# Patient Record
Sex: Female | Born: 2000 | Race: White | Hispanic: No | Marital: Single | Smoking: Never smoker
Health system: Southern US, Community
[De-identification: ages and names within clinical notes are randomized; demographics above are authoritative.]

## PROBLEM LIST (undated history)

## (undated) DIAGNOSIS — F319 Bipolar disorder, unspecified: Secondary | ICD-10-CM

## (undated) DIAGNOSIS — J302 Other seasonal allergic rhinitis: Secondary | ICD-10-CM

## (undated) DIAGNOSIS — F909 Attention-deficit hyperactivity disorder, unspecified type: Secondary | ICD-10-CM

---
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
## 2018-02-27 DIAGNOSIS — K5901 Slow transit constipation: Secondary | ICD-10-CM | POA: Insufficient documentation

## 2020-05-12 DIAGNOSIS — L732 Hidradenitis suppurativa: Secondary | ICD-10-CM | POA: Insufficient documentation

## 2021-08-02 ENCOUNTER — Observation Stay (HOSPITAL_COMMUNITY)
Admission: EM | Admit: 2021-08-02 | Discharge: 2021-08-03 | Disposition: A | Discharge status: Other Acute Inpatient Hospital | Payer: Medicaid Other | Attending: Internal Medicine | Admitting: Internal Medicine

## 2021-08-02 ENCOUNTER — Encounter (HOSPITAL_COMMUNITY): Payer: Self-pay

## 2021-08-02 ENCOUNTER — Other Ambulatory Visit: Payer: Self-pay

## 2021-08-02 DIAGNOSIS — R7989 Other specified abnormal findings of blood chemistry: Secondary | ICD-10-CM

## 2021-08-02 DIAGNOSIS — E669 Obesity, unspecified: Secondary | ICD-10-CM | POA: Insufficient documentation

## 2021-08-02 DIAGNOSIS — Z20822 Contact with and (suspected) exposure to covid-19: Secondary | ICD-10-CM | POA: Diagnosis not present

## 2021-08-02 DIAGNOSIS — T391X2A Poisoning by 4-Aminophenol derivatives, intentional self-harm, initial encounter: Principal | ICD-10-CM | POA: Diagnosis present

## 2021-08-02 DIAGNOSIS — E876 Hypokalemia: Secondary | ICD-10-CM | POA: Diagnosis not present

## 2021-08-02 DIAGNOSIS — Z79899 Other long term (current) drug therapy: Secondary | ICD-10-CM | POA: Insufficient documentation

## 2021-08-02 DIAGNOSIS — R7401 Elevation of levels of liver transaminase levels: Secondary | ICD-10-CM | POA: Diagnosis not present

## 2021-08-02 DIAGNOSIS — F1910 Other psychoactive substance abuse, uncomplicated: Secondary | ICD-10-CM | POA: Diagnosis present

## 2021-08-02 DIAGNOSIS — K2100 Gastro-esophageal reflux disease with esophagitis, without bleeding: Secondary | ICD-10-CM | POA: Diagnosis present

## 2021-08-02 DIAGNOSIS — Z6835 Body mass index (BMI) 35.0-35.9, adult: Secondary | ICD-10-CM | POA: Insufficient documentation

## 2021-08-02 DIAGNOSIS — F319 Bipolar disorder, unspecified: Secondary | ICD-10-CM | POA: Diagnosis present

## 2021-08-02 DIAGNOSIS — E66812 Obesity, class 2: Secondary | ICD-10-CM | POA: Diagnosis present

## 2021-08-02 LAB — CBC WITH DIFFERENTIAL/PLATELET
Abs Immature Granulocytes: 0.03 10*3/uL (ref 0.00–0.07)
Basophils Absolute: 0 10*3/uL (ref 0.0–0.1)
Basophils Relative: 0 %
Eosinophils Absolute: 0.1 10*3/uL (ref 0.0–0.5)
Eosinophils Relative: 1 %
HCT: 44.5 % (ref 36.0–46.0)
Hemoglobin: 15 g/dL (ref 12.0–15.0)
Immature Granulocytes: 0 %
Lymphocytes Relative: 25 %
Lymphs Abs: 2.7 10*3/uL (ref 0.7–4.0)
MCH: 27.3 pg (ref 26.0–34.0)
MCHC: 33.7 g/dL (ref 30.0–36.0)
MCV: 81.1 fL (ref 80.0–100.0)
Monocytes Absolute: 0.9 10*3/uL (ref 0.1–1.0)
Monocytes Relative: 8 %
Neutro Abs: 7.3 10*3/uL (ref 1.7–7.7)
Neutrophils Relative %: 66 %
Platelets: 340 10*3/uL (ref 150–400)
RBC: 5.49 MIL/uL — ABNORMAL HIGH (ref 3.87–5.11)
RDW: 13.7 % (ref 11.5–15.5)
WBC: 11 10*3/uL — ABNORMAL HIGH (ref 4.0–10.5)
nRBC: 0 % (ref 0.0–0.2)

## 2021-08-02 LAB — COMPREHENSIVE METABOLIC PANEL
ALT: 54 U/L — ABNORMAL HIGH (ref 0–44)
ALT: 44 U/L (ref 0–44)
AST: 45 U/L — ABNORMAL HIGH (ref 15–41)
AST: 21 U/L (ref 15–41)
Albumin: 4.7 g/dL (ref 3.5–5.0)
Albumin: 3.8 g/dL (ref 3.5–5.0)
Alkaline Phosphatase: 102 U/L (ref 38–126)
Alkaline Phosphatase: 74 U/L (ref 38–126)
Anion gap: 10 (ref 5–15)
Anion gap: 11 (ref 5–15)
BUN: 5 mg/dL — ABNORMAL LOW (ref 6–20)
BUN: <5 mg/dL — ABNORMAL LOW (ref 6–20)
CO2: 25 mmol/L (ref 22–32)
CO2: 25 mmol/L (ref 22–32)
Calcium: 10 mg/dL (ref 8.9–10.3)
Calcium: 9.4 mg/dL (ref 8.9–10.3)
Chloride: 102 mmol/L (ref 98–111)
Chloride: 103 mmol/L (ref 98–111)
Creatinine, Ser: 0.81 mg/dL (ref 0.44–1.00)
Creatinine, Ser: 0.56 mg/dL (ref 0.44–1.00)
GFR, Estimated: >60 mL/min (ref >60)
GFR, Estimated: >60 mL/min (ref >60)
Glucose, Bld: 107 mg/dL — ABNORMAL HIGH (ref 70–99)
Glucose, Bld: 102 mg/dL — ABNORMAL HIGH (ref 70–99)
Potassium: 4.2 mmol/L (ref 3.5–5.1)
Potassium: 3.1 mmol/L — ABNORMAL LOW (ref 3.5–5.1)
Sodium: 137 mmol/L (ref 135–145)
Sodium: 139 mmol/L (ref 135–145)
Total Bilirubin: 1.6 mg/dL — ABNORMAL HIGH (ref 0.3–1.2)
Total Bilirubin: 0.9 mg/dL (ref 0.3–1.2)
Total Protein: 9.1 g/dL — ABNORMAL HIGH (ref 6.5–8.1)
Total Protein: 7.5 g/dL (ref 6.5–8.1)

## 2021-08-02 LAB — MAGNESIUM: Magnesium: 2 mg/dL (ref 1.7–2.4)

## 2021-08-02 LAB — I-STAT BETA HCG BLOOD, ED (MC, WL, AP ONLY): I-stat hCG, quantitative: <5 m[IU]/mL (ref <5)

## 2021-08-02 LAB — RAPID URINE DRUG SCREEN, HOSP PERFORMED
Amphetamines: POSITIVE — AB
Barbiturates: NOT DETECTED
Benzodiazepines: NOT DETECTED
Cocaine: NOT DETECTED
Opiates: NOT DETECTED
Tetrahydrocannabinol: POSITIVE — AB

## 2021-08-02 LAB — PROTIME-INR
INR: 1 (ref 0.8–1.2)
Prothrombin Time: 13.2 seconds (ref 11.4–15.2)

## 2021-08-02 LAB — ACETAMINOPHEN LEVEL
Acetaminophen (Tylenol), Serum: 57 ug/mL — ABNORMAL HIGH (ref 10–30)
Acetaminophen (Tylenol), Serum: <10 ug/mL — ABNORMAL LOW (ref 10–30)

## 2021-08-02 LAB — SALICYLATE LEVEL: Salicylate Lvl: <7 mg/dL — ABNORMAL LOW (ref 7.0–30.0)

## 2021-08-02 LAB — PHOSPHORUS: Phosphorus: 3.2 mg/dL (ref 2.5–4.6)

## 2021-08-02 LAB — ETHANOL: Alcohol, Ethyl (B): <10 mg/dL (ref <10)

## 2021-08-02 MED ORDER — ONDANSETRON HCL 4 MG PO TABS: 4.0000 mg | ORAL_TABLET | Freq: Four times a day (QID) | ORAL | Status: DC | PRN

## 2021-08-02 MED ORDER — PANTOPRAZOLE SODIUM 40 MG PO TBEC
40.0000 mg | DELAYED_RELEASE_TABLET | Freq: Every day | ORAL | Status: DC
  Administered 2021-08-02 – 2021-08-03 (×2): 40 mg via ORAL
  Filled 2021-08-02 (×2): qty 1

## 2021-08-02 MED ORDER — DEXTROSE 5 % IV SOLN
15.0000 mg/kg/h | INTRAVENOUS | Status: DC
  Administered 2021-08-02 (×2): 15 mg/kg/h via INTRAVENOUS
  Filled 2021-08-02 (×3): qty 90

## 2021-08-02 MED ORDER — ACETYLCYSTEINE LOAD VIA INFUSION
150.0000 mg/kg | Freq: Once | INTRAVENOUS | Status: AC
Start: 2021-08-02 — End: 2021-08-02
  Administered 2021-08-02: 13605 mg via INTRAVENOUS
  Filled 2021-08-02: qty 446

## 2021-08-02 MED ORDER — LACTATED RINGERS IV SOLN: INTRAVENOUS | Status: DC

## 2021-08-02 MED ORDER — LACTATED RINGERS IV BOLUS
1000.0000 mL | Freq: Once | INTRAVENOUS | Status: AC
  Administered 2021-08-02: 1000 mL via INTRAVENOUS

## 2021-08-02 MED ORDER — ONDANSETRON HCL 4 MG/2ML IJ SOLN
4.0000 mg | Freq: Four times a day (QID) | INTRAMUSCULAR | Status: DC | PRN
  Administered 2021-08-02: 4 mg via INTRAVENOUS
  Filled 2021-08-02: qty 2

## 2021-08-02 NOTE — ED Notes (Signed)
Pt has been wanded by security. 

## 2021-08-02 NOTE — H&P (Signed)
History and Physical    Patient: Brittany Duncan WUJ:[id number] DOB: Jan 25, 2001 DOA: 08/02/2021 DOS: the patient was seen and examined on 08/02/2021 PCP: Derald Macleod, MD  Patient coming from: Home  Chief Complaint: No chief complaint on file.  HPI: Brittany Duncan is a 21 y.o. female with medical history significant of ADHD, bipolar disorder, seasonal allergies, GERD, class II obesity who is coming to the emergency department with complaints of worsening depression which prompted her to take 8 tablets of extra strength acetaminophen, alcohol, methamphetamine and cannabis.  She stated that she has had a lot of "life in general" stress, but did not specify what is specifically.  However, stated that her depression was worse.  She denied fever, chills, rhinorrhea, sore throat, wheezing or hemoptysis.  No chest pain, palpitations, diaphoresis, PND, orthopnea or pitting edema of the lower extremities.  No abdominal pain, nausea, emesis, diarrhea, constipation, melena or hematochezia.  No flank pain, dysuria, frequency or hematuria.  No polyuria, polydipsia, polyphagia or blurred vision.   ED course: Initial vital signs were temperature 97.8 F, pulse 82, respiration 13, BP 123/86 mmHg O2 sat 100% on room air.  The patient was started on Mucomyst per pharmacy.  I added 1000 mL of LR bolus, followed by LR infusion.  Lab work: UDS was positive for amphetamines and THC.  CBC showed a white count 11.0, with 66% neutrophils, hemoglobin 15.0 g/dL platelets 956.  Normal PT and INR.   Review of Systems: As mentioned in the history of present illness. All other systems reviewed and are negative.  Past Medical History:  Diagnosis Date   ADHD (attention deficit hyperactivity disorder)    Bipolar disorder (HCC)    Seasonal allergies    History reviewed. No pertinent surgical history. Social History:  reports that she has never smoked. She does not have any smokeless tobacco history on file. She reports  that she does not drink alcohol and does not use drugs.  Allergies  Allergen Reactions   Lactose Intolerance (Gi) Diarrhea    Family History  Problem Relation Age of Onset   Drug abuse Mother    Drug abuse Father     Prior to Admission medications   Medication Sig Start Date End Date Taking? Authorizing Provider  acetaminophen (TYLENOL) 500 MG tablet Take 1,000 mg by mouth every 6 (six) hours as needed for mild pain.   Yes [provider]  Melatonin 5 MG CHEW Chew 5 mg by mouth at bedtime as needed (sleep).   Yes [provider]    Physical Exam: Vitals:   08/02/21 1300 08/02/21 1330 08/02/21 1400 08/02/21 1542  BP: 121/85 107/68 118/83 110/68  Pulse: 63 86 71 61  Resp:   18 (!) 22  Temp:    (!) 97.5 F (36.4 C)  TempSrc:    Oral  SpO2: 98% 100% 99% 100%  Weight:      Height:       Physical Exam Vitals and nursing note reviewed.  Constitutional:      Appearance: Normal appearance. She is obese.  HENT:     Head: Normocephalic.     Mouth/Throat:     Mouth: Mucous membranes are moist.  Eyes:     General: No scleral icterus.    Pupils: Pupils are equal, round, and reactive to light.  Neck:     Vascular: No JVD.  Cardiovascular:     Rate and Rhythm: Regular rhythm. Tachycardia present.     Heart sounds: S1 normal and S2  normal.  Pulmonary:     Effort: Pulmonary effort is normal.     Breath sounds: No wheezing, rhonchi or rales.  Abdominal:     General: Bowel sounds are normal.     Palpations: Abdomen is soft.     Tenderness: There is no abdominal tenderness. There is no right CVA tenderness, left CVA tenderness or guarding.  Musculoskeletal:     Cervical back: Neck supple.     Right lower leg: No edema.     Left lower leg: No edema.  Skin:    General: Skin is warm and dry.  Neurological:     General: No focal deficit present.     Mental Status: She is alert and oriented to person, place, and time.  Psychiatric:        Mood and Affect: Mood  normal.        Behavior: Behavior normal.   Data Reviewed:  There are no new results to review at this time.  Assessment and Plan: Principal Problem:   Acetaminophen overdose,  intentional self-harm, initial encounter (HCC) Observation/PCU. Continue acetylcysteine per pharmacy. Continue IV fluids. F/up acetaminophen level. Monitor hepatic function. Monitor phosphorus level. One-to-one observation.  Active Problems:   Polysubstance abuse (HCC) Consult behavioral health and TOC.    Bipolar 1 disorder (HCC) Consult behavioral health.    GERD with history of esophagitis Resume pantoprazole.    Class 2 obesity With a BMI of 35.43 kg/m. Lifestyle modifications advised. Follow-up with primary moderate care provider.    Advance Care Planning:   Code Status: Full Code   Consults: Behavioral health.  Family Communication:   Severity of Illness: The appropriate patient status for this patient is OBSERVATION. Observation status is judged to be reasonable and necessary in order to provide the required intensity of service to ensure the patient's safety. The patient's presenting symptoms, physical exam findings, and initial radiographic and laboratory data in the context of their medical condition is felt to place them at decreased risk for further clinical deterioration. Furthermore, it is anticipated that the patient will be medically stable for discharge from the hospital within 2 midnights of admission.   Author: Bobette Mo, MD 08/02/2021 4:33 PM  For on call review [email or web address].   This document was prepared using [employer] and may contain some unintended transcription errors

## 2021-08-02 NOTE — ED Notes (Signed)
Pt said she doesn't need to use the bathroom right now

## 2021-08-02 NOTE — ED Triage Notes (Signed)
Pt BIB EMS from home. Patient feeling more depressed. Patient took handful of tylenol and does confess to using some alcohol.

## 2021-08-02 NOTE — Progress Notes (Signed)
Pharmacy Consult for Acetadote  Patient Measurements: Height: 5\' 3"  (160 cm) Weight: 90.7 kg (200 lb) IBW/kg (Calculated) : 52.4  Labs: APAP = 57 AST = 45 ALT = 54 Tbili = 1.6  Plan:  Per [place] [facility] recommendations: Acetadote 150mg /kg IV bolus followed by 15mg /kg/hr continuous infusion.  Check APAP and LFTs in 12 hrs.  If APAP undetectable and LFTs trending down, can stop infusion.  If not, then continue infusion and recheck labs in another 12 hours.  , PharmD, BCPS Pharmacy: [phone] 08/02/2021,9:29 AM

## 2021-08-02 NOTE — ED Provider Notes (Signed)
[facility]-EMERGENCY DEPT Provider Note   CSN: [id number] Arrival date & time: 08/02/21  4193     History  No chief complaint on file.   Brittany Duncan is a 21 y.o. female.  She has a history of bipolar ADHD.  She has been off her medicines for over a year.  She said she started using methamphetamines about a month ago.  Today tried to kill herself by overdosing on Tylenol.  She said she took 8 tablets in order to hurt herself.  She also admits to alcohol and methamphetamines.  She does denies any medical complaints.  She is not sure when her last period was.  Sexually active.  She does state that she has been psychiatric hospitalized before and it did not help.  She is not currently seeing a physician or psychiatrist  The history is provided by the patient.  Mental Health Problem Presenting symptoms: depression and suicide attempt   Onset quality:  Unable to specify Timing:  Unable to specify Progression:  Unchanged Chronicity:  Recurrent Context: alcohol use and drug abuse   Treatment compliance:  Untreated Relieved by:  None tried Ineffective treatments:  None tried Associated symptoms: no abdominal pain, no chest pain and no headaches   Risk factors: hx of mental illness       Home Medications Prior to Admission medications   Medication Sig Start Date End Date Taking? Authorizing Provider  acetaminophen (TYLENOL) 500 MG tablet Take 500 mg by mouth every 6 (six) hours as needed for mild pain.    [provider]  cetirizine (ZYRTEC) 5 MG tablet Take 5 mg by mouth at bedtime.    [provider]  FLUoxetine (PROZAC) 10 MG capsule Take 10 mg by mouth at bedtime.    [provider]  methylphenidate 54 MG PO CR tablet Take 54 mg by mouth every morning.    [provider]  minocycline (MINOCIN,DYNACIN) 50 MG capsule Take 50 mg by mouth daily.    [provider]      Allergies    Lactose intolerance (gi)     Review of Systems   Review of Systems  Constitutional:  Negative for fever.  HENT:  Negative for sore throat.   Eyes:  Negative for visual disturbance.  Respiratory:  Negative for shortness of breath.   Cardiovascular:  Negative for chest pain.  Gastrointestinal:  Negative for abdominal pain.  Genitourinary:  Negative for dysuria.  Musculoskeletal:  Negative for neck pain.  Skin:  Negative for rash.  Neurological:  Negative for headaches.   Physical Exam Updated Vital Signs BP 123/80   Pulse 65   Resp 19   Ht 5\' 3"  (1.6 m)   Wt 90.7 kg   SpO2 99%   BMI 35.43 kg/m  Physical Exam Vitals and nursing note reviewed.  Constitutional:      General: She is not in acute distress.    Appearance: Normal appearance. She is well-developed.  HENT:     Head: Normocephalic and atraumatic.  Eyes:     Conjunctiva/sclera: Conjunctivae normal.  Cardiovascular:     Rate and Rhythm: Normal rate and regular rhythm.     Heart sounds: No murmur heard. Pulmonary:     Effort: Pulmonary effort is normal. No respiratory distress.     Breath sounds: Normal breath sounds.  Abdominal:     Palpations: Abdomen is soft.     Tenderness: There is no abdominal tenderness.  Musculoskeletal:  General: No swelling.     Cervical back: Neck supple.  Skin:    General: Skin is warm and dry.     Capillary Refill: Capillary refill takes less than 2 seconds.  Neurological:     General: No focal deficit present.     Mental Status: She is alert.    ED Results / Procedures / Treatments   Labs (all labs ordered are listed, but only abnormal results are displayed) Labs Reviewed  COMPREHENSIVE METABOLIC PANEL - Abnormal; Notable for the following components:      Result Value   Glucose, Bld 107 (*)    BUN 5 (*)    Total Protein 9.1 (*)    AST 45 (*)    ALT 54 (*)    Total Bilirubin 1.6 (*)    All other components within normal limits  CBC WITH DIFFERENTIAL/PLATELET - Abnormal; Notable for the  following components:   WBC 11.0 (*)    RBC 5.49 (*)    All other components within normal limits  SALICYLATE LEVEL - Abnormal; Notable for the following components:   Salicylate Lvl <7.0 (*)    All other components within normal limits  ACETAMINOPHEN LEVEL - Abnormal; Notable for the following components:   Acetaminophen (Tylenol), Serum 57 (*)    All other components within normal limits  ETHANOL  RAPID URINE DRUG SCREEN, HOSP PERFORMED  I-STAT BETA HCG BLOOD, ED ([facility], [facility], [facility] ONLY)    EKG EKG Interpretation  Date/Time:  Thursday August 02 2021 07:53:49 EDT Ventricular Rate:  76 PR Interval:  189 QRS Duration: 77 QT Interval:  372 QTC Calculation: 419 R Axis:   67 Text Interpretation: Sinus rhythm no acute ST, Ts No old tracing to compare Confirmed by Meridee Score [phone]) on 08/02/2021 7:58:38 AM  Radiology No results found.  Procedures .Critical Care Performed by: Terrilee Files, MD Authorized by: Terrilee Files, MD   Critical care provider statement:    Critical care time (minutes):  45   Critical care time was exclusive of:  Separately billable procedures and treating other patients   Critical care was necessary to treat or prevent imminent or life-threatening deterioration of the following conditions:  Toxidrome   Critical care was time spent personally by me on the following activities:  Development of treatment plan with patient or surrogate, discussions with consultants, evaluation of patient's response to treatment, examination of patient, obtaining history from patient or surrogate, ordering and performing treatments and interventions, ordering and review of laboratory studies, ordering and review of radiographic studies, pulse oximetry, re-evaluation of patient's condition and review of old charts   I assumed direction of critical care for this patient from another provider in my specialty: no      Medications Ordered in ED Medications  lactated ringers  infusion ( Intravenous New Bag/Given 08/02/21 1653)  ondansetron (ZOFRAN) tablet 4 mg ( Oral See Alternative 08/02/21 1108)    Or  ondansetron (ZOFRAN) injection 4 mg (4 mg Intravenous Given 08/02/21 1108)  acetylcysteine (ACETADOTE) 30.5 mg/mL load via infusion 13,605 mg (13,605 mg Intravenous Bolus from Bag 08/02/21 1002)    Followed by  acetylcysteine (ACETADOTE) 18,000 mg in dextrose 5 % 590 mL (30.5085 mg/mL) infusion (15 mg/kg/hr  90.7 kg Intravenous New Bag/Given 08/02/21 1629)  lactated ringers bolus 1,000 mL (0 mLs Intravenous Stopped 08/02/21 1045)    ED Course/ Medical Decision Making/ A&P Clinical Course as of 08/02/21 1716  Thu Aug 02, 2021  0836 AST and ALT  were normal as of 8 months ago. [MB]  [id number] Patient's Tylenol level is detectable and LFTs are mildly elevated.  Discussed with poison control.  They are recommending NAC for 12 hours repeat Tylenol repeat LFTs.  If Tylenol trending to 0 and LFTs not elevated can stop NAC.  Otherwise may need to continue for another 12 hours. [MB]  [id number] Discussed with Dr. Robb Matarrtiz [facility] hospitalist who will evaluate patient for admission.  Patient updated on plan for medical admission and psychiatric evaluation to follow-up. [MB]    Clinical Course User Index [MB] Terrilee FilesButler, Giovanie Lefebre C, MD                           Medical Decision Making Amount and/or Complexity of Data Reviewed Labs: ordered.  Risk Decision regarding hospitalization.  This patient complains of depression Tylenol overdose; this involves an extensive number of treatment Options and is a complaint that carries with it a high risk of complications and morbidity. The differential includes: Overdose, liver failure, depression, suicidal ideation, substance abuse  I ordered, reviewed and interpreted labs, which included CBC with mildly elevated white count normal hemoglobin, chemistries normal, LFTs mildly elevated, Tylenol level detectable INR normal I ordered medication IV fluids and  N-acetylcysteine and reviewed PMP when indicated.  Previous records obtained and reviewed in epic, no recent admissions I consulted poison control and [facility] hospitalist Dr. Robb Matarrtiz and discussed lab and imaging findings and discussed disposition.  Cardiac monitoring reviewed, normal sinus rhythm Social determinants considered, polysubstance abuse Critical Interventions: Initiation of NAC for Tylenol overdose with elevated LFTs  After the interventions stated above, I reevaluated the patient and found patient hemodynamically stable Admission and further testing considered, will need medical admission for further stabilization acetaminophen overdose.  Ultimately will need psychiatry involvement.  IVC placed.          Final Clinical Impression(s) / ED Diagnoses Final diagnoses:  Acetaminophen overdose, intentional self-harm, initial encounter (HCC)  Elevated LFTs    Rx / DC Orders ED Discharge Orders     None         Terrilee FilesButler, Shavonta Gossen C, MD 08/02/21 1719

## 2021-08-03 ENCOUNTER — Encounter (HOSPITAL_COMMUNITY): Payer: Self-pay | Admitting: Family

## 2021-08-03 ENCOUNTER — Inpatient Hospital Stay (HOSPITAL_COMMUNITY)
Admission: AD | Admit: 2021-08-03 | Discharge: 2021-08-06 | DRG: 897 | Disposition: A | Discharge status: Home / Self Care | Payer: No Typology Code available for payment source | Source: Intra-hospital | Attending: Psychiatry | Admitting: Psychiatry

## 2021-08-03 ENCOUNTER — Other Ambulatory Visit: Payer: Self-pay

## 2021-08-03 DIAGNOSIS — F1910 Other psychoactive substance abuse, uncomplicated: Secondary | ICD-10-CM

## 2021-08-03 DIAGNOSIS — F909 Attention-deficit hyperactivity disorder, unspecified type: Secondary | ICD-10-CM | POA: Diagnosis present

## 2021-08-03 DIAGNOSIS — F1220 Cannabis dependence, uncomplicated: Secondary | ICD-10-CM | POA: Diagnosis present

## 2021-08-03 DIAGNOSIS — F17200 Nicotine dependence, unspecified, uncomplicated: Secondary | ICD-10-CM | POA: Diagnosis present

## 2021-08-03 DIAGNOSIS — F1090 Alcohol use, unspecified, uncomplicated: Secondary | ICD-10-CM | POA: Diagnosis present

## 2021-08-03 DIAGNOSIS — E669 Obesity, unspecified: Secondary | ICD-10-CM | POA: Diagnosis not present

## 2021-08-03 DIAGNOSIS — F319 Bipolar disorder, unspecified: Secondary | ICD-10-CM

## 2021-08-03 DIAGNOSIS — Z79899 Other long term (current) drug therapy: Secondary | ICD-10-CM

## 2021-08-03 DIAGNOSIS — T391X2A Poisoning by 4-Aminophenol derivatives, intentional self-harm, initial encounter: Secondary | ICD-10-CM | POA: Diagnosis present

## 2021-08-03 DIAGNOSIS — Z818 Family history of other mental and behavioral disorders: Secondary | ICD-10-CM | POA: Diagnosis not present

## 2021-08-03 DIAGNOSIS — F1310 Sedative, hypnotic or anxiolytic abuse, uncomplicated: Secondary | ICD-10-CM | POA: Diagnosis present

## 2021-08-03 DIAGNOSIS — T1491XA Suicide attempt, initial encounter: Secondary | ICD-10-CM | POA: Diagnosis not present

## 2021-08-03 DIAGNOSIS — F1594 Other stimulant use, unspecified with stimulant-induced mood disorder: Secondary | ICD-10-CM | POA: Diagnosis present

## 2021-08-03 DIAGNOSIS — Z20822 Contact with and (suspected) exposure to covid-19: Secondary | ICD-10-CM | POA: Diagnosis present

## 2021-08-03 DIAGNOSIS — E876 Hypokalemia: Secondary | ICD-10-CM | POA: Diagnosis present

## 2021-08-03 DIAGNOSIS — F410 Panic disorder [episodic paroxysmal anxiety] without agoraphobia: Secondary | ICD-10-CM | POA: Diagnosis present

## 2021-08-03 DIAGNOSIS — F4310 Post-traumatic stress disorder, unspecified: Secondary | ICD-10-CM | POA: Diagnosis present

## 2021-08-03 DIAGNOSIS — F1994 Other psychoactive substance use, unspecified with psychoactive substance-induced mood disorder: Principal | ICD-10-CM

## 2021-08-03 LAB — PROTIME-INR
INR: 1.1 (ref 0.8–1.2)
Prothrombin Time: 13.6 seconds (ref 11.4–15.2)

## 2021-08-03 LAB — CBC
HCT: 39.9 % (ref 36.0–46.0)
Hemoglobin: 12.9 g/dL (ref 12.0–15.0)
MCH: 27 pg (ref 26.0–34.0)
MCHC: 32.3 g/dL (ref 30.0–36.0)
MCV: 83.5 fL (ref 80.0–100.0)
Platelets: 242 10*3/uL (ref 150–400)
RBC: 4.78 MIL/uL (ref 3.87–5.11)
RDW: 14 % (ref 11.5–15.5)
WBC: 7.8 10*3/uL (ref 4.0–10.5)
nRBC: 0 % (ref 0.0–0.2)

## 2021-08-03 LAB — COMPREHENSIVE METABOLIC PANEL
ALT: 35 U/L (ref 0–44)
AST: 17 U/L (ref 15–41)
Albumin: 3.3 g/dL — ABNORMAL LOW (ref 3.5–5.0)
Alkaline Phosphatase: 69 U/L (ref 38–126)
Anion gap: 9 (ref 5–15)
BUN: <5 mg/dL — ABNORMAL LOW (ref 6–20)
CO2: 25 mmol/L (ref 22–32)
Calcium: 9.1 mg/dL (ref 8.9–10.3)
Chloride: 106 mmol/L (ref 98–111)
Creatinine, Ser: 0.61 mg/dL (ref 0.44–1.00)
GFR, Estimated: >60 mL/min (ref >60)
Glucose, Bld: 89 mg/dL (ref 70–99)
Potassium: 3.4 mmol/L — ABNORMAL LOW (ref 3.5–5.1)
Sodium: 140 mmol/L (ref 135–145)
Total Bilirubin: 0.7 mg/dL (ref 0.3–1.2)
Total Protein: 6.5 g/dL (ref 6.5–8.1)

## 2021-08-03 LAB — HIV ANTIBODY (ROUTINE TESTING W REFLEX): HIV Screen 4th Generation wRfx: NONREACTIVE

## 2021-08-03 LAB — PHOSPHORUS: Phosphorus: 3.4 mg/dL (ref 2.5–4.6)

## 2021-08-03 LAB — SARS CORONAVIRUS 2 BY RT PCR: SARS Coronavirus 2 by RT PCR: NEGATIVE

## 2021-08-03 MED ORDER — POTASSIUM CHLORIDE CRYS ER 20 MEQ PO TBCR
40.0000 meq | EXTENDED_RELEASE_TABLET | ORAL | Status: DC
  Administered 2021-08-03: 40 meq via ORAL
  Filled 2021-08-03 (×2): qty 2

## 2021-08-03 MED ORDER — LORAZEPAM 1 MG PO TABS
1.0000 mg | ORAL_TABLET | ORAL | Status: DC | PRN
Start: 2021-08-03 — End: 2021-08-06
  Filled 2021-08-03: qty 1

## 2021-08-03 MED ORDER — TRAZODONE HCL 50 MG PO TABS: 50.0000 mg | ORAL_TABLET | Freq: Every evening | ORAL | Status: DC | PRN

## 2021-08-03 MED ORDER — PANTOPRAZOLE SODIUM 40 MG PO TBEC
40.0000 mg | DELAYED_RELEASE_TABLET | Freq: Every day | ORAL | Status: DC
  Filled 2021-08-03 (×3): qty 1

## 2021-08-03 MED ORDER — ZIPRASIDONE MESYLATE 20 MG IM SOLR: 20.0000 mg | Freq: Two times a day (BID) | INTRAMUSCULAR | Status: DC | PRN

## 2021-08-03 MED ORDER — HYDROXYZINE HCL 25 MG PO TABS: 25.0000 mg | ORAL_TABLET | Freq: Three times a day (TID) | ORAL | Status: DC | PRN

## 2021-08-03 MED ORDER — ALUM & MAG HYDROXIDE-SIMETH 200-200-20 MG/5ML PO SUSP: 30.0000 mL | ORAL | Status: DC | PRN

## 2021-08-03 MED ORDER — MAGNESIUM HYDROXIDE 400 MG/5ML PO SUSP: 30.0000 mL | Freq: Every day | ORAL | Status: DC | PRN

## 2021-08-03 NOTE — Progress Notes (Signed)
Pt informed of transfer to [facility].  Pt became upset. Threw socks and cup.  Allowed IV's to be removed.  Report called to [facility] nurse for room 300-1.  [employer] called for transport.  Pt was compliant with [employer] for transport and left in handcuffs with 2 officers.  Pt belongings given to officer along with paperwork.

## 2021-08-03 NOTE — TOC Transition Note (Signed)
Transition of Care [facility]) - CM/SW Discharge Note   Patient Details  Name: Brittany Duncan MRN: [id number] Date of Birth: 2001/02/10  [facility] CM/SW Contact:  Lanier Clam, RN Phone Number: 08/03/2021, 2:02 PM   Clinical Narrative: d/c IP Psych-[facility] await covid results,rm#, tel# report. IVC-Sheriff to be called once ready-[phone]-prompt 1,3 for Sheriff([employer]) for transport.      Final next level of care: Psychiatric Hospital Barriers to Discharge: No Barriers Identified   Patient Goals and CMS Choice Patient states their goals for this hospitalization and ongoing recovery are:: Inpt Psych-IVC CMS Medicare.gov Compare Post Acute Care list provided to:: Patient Choice offered to / list presented to : Patient  Discharge Placement                       Discharge Plan and Services   Discharge Planning Services: CM Consult                                 Social Determinants of Health (SDOH) Interventions     Readmission Risk Interventions     View : No data to display.

## 2021-08-03 NOTE — Progress Notes (Signed)
Adult Psychoeducational Group Note  Date:  08/03/2021 Time:  8:23 PM  Group Topic/Focus:  Wrap-Up Group:   The focus of this group is to help patients review their daily goal of treatment and discuss progress on daily workbooks.  Participation Level:  Did Not Attend  Participation Quality:  Did Not Attend  Affect:  Did Not Attend  Cognitive:  Did Not Attend  Insight: Did Not Attend  Engagement in Group:  Did Not Attend  Modes of Intervention:  Did Not Attend  Additional Comments:  Pt was encouraged to attend wrap-up group but refused  Brittany Duncan 08/03/2021, 8:23 PM
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
## 2021-08-03 NOTE — Discharge Summary (Signed)
Physician Discharge Summary  [place] OJJ:[id number] DOB: Apr 25, 2000 DOA: 08/02/2021  PCP: Derald Macleod, MD  Admit date: 08/02/2021 Discharge date: 08/03/2021  Admitted From: Home Disposition: [facility]  Recommendations for Outpatient Follow-up:  Follow up with [facility] provider at earliest convenience   Home Health: No Equipment/Devices: None  Discharge Condition: Stable CODE STATUS: Full Diet recommendation: Regular Brief/Interim Summary: 21 y.o. female with medical history significant of ADHD, bipolar disorder, seasonal allergies, GERD presented with worsening depression which prompted her to take 8 tablets of extra strength acetaminophen along with alcohol, methamphetamine and cannabis.  On presentation, UDS was positive for amphetamines and THC, poison control was contacted and recommended IV N-acetylcysteine and LFT monitoring.  Subsequently, LFTs normalized and N-acetylcysteine has been discontinued.  Patient will need inpatient psychiatric hospitalization.  She will be discharged to inpatient psychiatric hospital once bed is available.   Discharge Diagnoses:   Acetaminophen overdose Intentional self-harm/suicidal attempt Bipolar 1 disorder History of polysubstance abuse -Presented with overdose as above.  On presentation, UDS was positive for amphetamines and THC, poison control was contacted and recommended IV N-acetylcysteine and LFT monitoring.  Subsequently, LFTs normalized and N-acetylcysteine has been discontinued.  Communicated with [place]/psychiatry NP via secure chat: Patient will need inpatient psychiatric hospitalization.  She will be discharged to inpatient psychiatric hospital once bed is available. -She is currently medically stable for discharge  Obesity -Outpatient follow-up  Hypokalemia -Replace prior to discharge  Discharge Instructions  Discharge Instructions     Diet general   Complete by: As directed    Increase activity slowly   Complete by: As  directed       Allergies as of 08/03/2021       Reactions   Lactose Intolerance (gi) Diarrhea        Medication List     STOP taking these medications    acetaminophen 500 MG tablet Commonly known as: TYLENOL   Melatonin 5 MG Chew        Allergies  Allergen Reactions   Lactose Intolerance (Gi) Diarrhea    Consultations: Psychiatry   Procedures/Studies: No results found.    Subjective: Patient seen and examined at bedside.  Denies any worsening abdominal pain, nausea, vomiting or fever.  Discharge Exam: Vitals:   08/02/21 2351 08/03/21 0341  BP: 103/72 112/62  Pulse: 62 64  Resp: 20 20  Temp: 98.9 F (37.2 C) 98.9 F (37.2 C)  SpO2: 99% 99%    General: Pt is alert, awake, not in acute distress.  Flat affect.  Currently on room air.  Slow to respond. Cardiovascular: rate controlled, S1/S2 + Respiratory: bilateral decreased breath sounds at bases Abdominal: Soft, obese, NT, ND, bowel sounds + Extremities: no edema, no cyanosis    The results of significant diagnostics from this hospitalization (including imaging, microbiology, ancillary and laboratory) are listed below for reference.     Microbiology: No results found for this or any previous visit (from the past 240 hour(s)).   Labs: BNP (last 3 results) No results for input(s): BNP in the last 8760 hours. Basic Metabolic Panel: Recent Labs  Lab 08/02/21 0748 08/02/21 2206 08/03/21 0707  NA 137 139 140  K 4.2 3.1* 3.4*  CL 102 103 106  CO2 25 25 25   GLUCOSE 107* 102* 89  BUN 5* <5* <5*  CREATININE 0.81 0.56 0.61  CALCIUM 10.0 9.4 9.1  MG 2.0  --   --   PHOS 3.2  --  3.4   Liver Function Tests: Recent Labs  Lab 08/02/21 0748 08/02/21 2206 08/03/21 0707  AST 45* 21 17  ALT 54* 44 35  ALKPHOS 102 74 69  BILITOT 1.6* 0.9 0.7  PROT 9.1* 7.5 6.5  ALBUMIN 4.7 3.8 3.3*   No results for input(s): LIPASE, AMYLASE in the last 168 hours. No results for input(s): AMMONIA in the  last 168 hours. CBC: Recent Labs  Lab 08/02/21 0748 08/03/21 0707  WBC 11.0* 7.8  NEUTROABS 7.3  --   HGB 15.0 12.9  HCT 44.5 39.9  MCV 81.1 83.5  PLT 340 242   Cardiac Enzymes: No results for input(s): CKTOTAL, CKMB, CKMBINDEX, TROPONINI in the last 168 hours. BNP: Invalid input(s): POCBNP CBG: No results for input(s): GLUCAP in the last 168 hours. D-Dimer No results for input(s): DDIMER in the last 72 hours. Hgb A1c No results for input(s): HGBA1C in the last 72 hours. Lipid Profile No results for input(s): CHOL, HDL, LDLCALC, TRIG, CHOLHDL, LDLDIRECT in the last 72 hours. Thyroid function studies No results for input(s): TSH, T4TOTAL, T3FREE, THYROIDAB in the last 72 hours.  Invalid input(s): FREET3 Anemia work up No results for input(s): VITAMINB12, FOLATE, FERRITIN, TIBC, IRON, RETICCTPCT in the last 72 hours. Urinalysis No results found for: COLORURINE, APPEARANCEUR, LABSPEC, PHURINE, GLUCOSEU, HGBUR, BILIRUBINUR, KETONESUR, PROTEINUR, UROBILINOGEN, NITRITE, LEUKOCYTESUR Sepsis Labs Invalid input(s): PROCALCITONIN,  WBC,  LACTICIDVEN Microbiology No results found for this or any previous visit (from the past 240 hour(s)).   Time coordinating discharge: 35 minutes  SIGNED:   Glade Lloyd, MD  [facility] 08/03/2021, 9:43 AM

## 2021-08-03 NOTE — Progress Notes (Signed)
Admission Note: Patient is a 21 year old female admitted to the unit from [facility] under IVC for suicide attempt by overdose on Tylenol, Alcohol, Meth, and THC.  Patient reports family conflict, complicated home situation and medication noncompliance.  Patient presents with irritable mood and angry affect.  Stated she is not going to take any medication due to side effects.  Patient currently denies suicidal ideation and would not participate with admission assessment.  Admission plan of care reviewed, consent signed.  Skin assessment and personal belongings completed.  Skin is dry and intact.  No contraband found.  Patient oriented to the unit, staff and room.  Routine safety checks initiated.  Patient is safe on the unit.

## 2021-08-03 NOTE — Tx Team (Signed)
Initial Treatment Plan 08/03/2021 6:21 PM Brittany Duncan TKZ:[id number]    PATIENT STRESSORS: Financial difficulties   Marital or family conflict   Substance abuse     PATIENT STRENGTHS: Supportive family/friends    PATIENT IDENTIFIED PROBLEMS: Suicidal ideation  Depression  Anxiety  Substance Abuse  Medication noncompliance             DISCHARGE CRITERIA:  Adequate post-discharge living arrangements Safe-care adequate arrangements made  PRELIMINARY DISCHARGE PLAN: Attend aftercare/continuing care group Outpatient therapy Return to previous living arrangement  PATIENT/FAMILY INVOLVEMENT: This treatment plan has been presented to and reviewed with the patient, Brittany Duncan, and/or family member.  The patient and family have been given the opportunity to ask questions and make suggestions.  Clarene Critchley, RN 08/03/2021, 6:21 PM

## 2021-08-03 NOTE — Progress Notes (Signed)
   08/03/21 2118  Psych Admission Type (Psych Patients Only)  Admission Status Involuntary  Psychosocial Assessment  Patient Complaints Anxiety  Eye Contact Brief  Facial Expression Flat  Affect Apprehensive  Speech Logical/coherent  Interaction Assertive  Motor Activity Other (Comment) (WDL)  Appearance/Hygiene In scrubs  Behavior Characteristics Appropriate to situation;Guarded  Mood Labile  Thought Process  Coherency Concrete thinking  Content Blaming others  Delusions None reported or observed  Perception WDL  Hallucination None reported or observed  Judgment Poor  Confusion None  Danger to Self  Current suicidal ideation? Denies  Danger to Others  Danger to Others None reported or observed

## 2021-08-04 DIAGNOSIS — F1994 Other psychoactive substance use, unspecified with psychoactive substance-induced mood disorder: Principal | ICD-10-CM

## 2021-08-04 DIAGNOSIS — F4310 Post-traumatic stress disorder, unspecified: Secondary | ICD-10-CM

## 2021-08-04 MED ORDER — QUETIAPINE FUMARATE 50 MG PO TABS
50.0000 mg | ORAL_TABLET | Freq: Every evening | ORAL | Status: DC | PRN
  Filled 2021-08-04: qty 1

## 2021-08-04 MED ORDER — LOPERAMIDE HCL 2 MG PO CAPS: 2.0000 mg | ORAL_CAPSULE | ORAL | Status: DC | PRN

## 2021-08-04 MED ORDER — ADULT MULTIVITAMIN W/MINERALS CH
1.0000 | ORAL_TABLET | Freq: Every day | ORAL | Status: DC
  Administered 2021-08-05 – 2021-08-06 (×2): 1 via ORAL
  Filled 2021-08-04 (×4): qty 1

## 2021-08-04 MED ORDER — THIAMINE HCL 100 MG PO TABS
100.0000 mg | ORAL_TABLET | Freq: Every day | ORAL | Status: DC
  Administered 2021-08-05 – 2021-08-06 (×2): 100 mg via ORAL
  Filled 2021-08-04 (×3): qty 1

## 2021-08-04 MED ORDER — LORAZEPAM 1 MG PO TABS: 1.0000 mg | ORAL_TABLET | Freq: Four times a day (QID) | ORAL | Status: DC | PRN

## 2021-08-04 MED ORDER — PANTOPRAZOLE SODIUM 40 MG PO TBEC: 40.0000 mg | DELAYED_RELEASE_TABLET | Freq: Every day | ORAL | Status: DC | PRN

## 2021-08-04 MED ORDER — HYDROXYZINE HCL 25 MG PO TABS
25.0000 mg | ORAL_TABLET | Freq: Three times a day (TID) | ORAL | Status: DC | PRN
Start: 2021-08-04 — End: 2021-08-06

## 2021-08-04 MED ORDER — ONDANSETRON 4 MG PO TBDP: 4.0000 mg | ORAL_TABLET | Freq: Four times a day (QID) | ORAL | Status: DC | PRN

## 2021-08-04 MED ORDER — NICOTINE POLACRILEX 2 MG MT GUM
2.0000 mg | CHEWING_GUM | OROMUCOSAL | Status: DC | PRN
  Filled 2021-08-04: qty 1

## 2021-08-04 MED ORDER — THIAMINE HCL 100 MG/ML IJ SOLN: 100.0000 mg | Freq: Once | INTRAMUSCULAR | Status: DC

## 2021-08-04 MED ORDER — HYDROXYZINE HCL 25 MG PO TABS: 25.0000 mg | ORAL_TABLET | Freq: Four times a day (QID) | ORAL | Status: DC | PRN
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
## 2021-08-04 NOTE — [facility] Suicide Risk Assessment (Cosign Needed)
Suicide Risk Assessment  Admission Assessment    [facility] Admission Suicide Risk Assessment   Nursing information obtained from:  Patient Demographic factors:  Adolescent or young adult Current Mental Status:  Self-harm behaviors Loss Factors:  Financial problems / change in socioeconomic status, Decrease in vocational status Historical Factors:  Impulsivity Risk Reduction Factors:  Living with another person, especially a relative  Total Time spent with patient: 1 hour Principal Problem: Suicide attempt (HCC) Diagnosis:  Principal Problem:   Suicide attempt (HCC)  Subjective Data: Brittany Duncan is a 21 yo patient w/ PPH of Bipolar disorder (?), anxiety, polysubstance use,  and ADHD. Patient was transferred from [facility] medical floor to [facility] after SA via OD with 8 Tylenol Extra strength combined with Etoh and Meth in her system.   On assessment this AM patient reports that she is aware she was transferred to the [facility] due to her OD. Patient reports that she and Lawanna Kobus [phone]) are normally very close. Patient reports that they made a new friend, [name] around 02/2021. Patient reports that with [name] they do illicit substances including meth, cocaine, and BZDs, as well as drink Etoh.  Patient reports she became upset because she felt abandoned by her sister, who seemed to wish to stay with [name], and did not think anything was wrong. Patient reports it was these thoughts regarding her relationship with her sister, that led to her OD because she wants her sister to "recognize how serious" she is. Patient reports that on the day of her OD, she had already been drinking 1.5 bottle of a bootlegger and done meth. Patient reports she had been doing meth daily for 3 weeks. Patient reports that she wants her sister to chose the "sober" version of the patient vs [name]. Patient reports she has an extensive hx of being medicated for psychiatric dx until she was 21 yo, when she stopped taking her medication by choice.  Patient is adamant on assessment today that she will not take any medications when she leaves the hospital, because she does not like taking medications. Patient reports that she feels that medicine "controls" her. On assessment today patient denies SI, HI and AVH, patient also denies any belief of thought insertion or deletion, as well as delusions of special powers, or specific messages to her.  Continued Clinical Symptoms:    The "Alcohol Use Disorders Identification Test", Guidelines for Use in Primary Care, Second Edition.  World Science writer [facility]). Score between 0-7:  no or low risk or alcohol related problems. Score between 8-15:  moderate risk of alcohol related problems. Score between 16-19:  high risk of alcohol related problems. Score 20 or above:  warrants further diagnostic evaluation for alcohol dependence and treatment.   CLINICAL FACTORS:   Alcohol/Substance Abuse/Dependencies   Musculoskeletal: Strength & Muscle Tone: within normal limits Gait & Station: normal Patient leans: N/A  Psychiatric Specialty Exam:  Presentation  General Appearance: Appropriate for Environment; Casual  Eye Contact:Fair (towards the end eye contact became avoidant)  Speech:Clear and Coherent  Speech Volume:Normal  Handedness:Right   Mood and Affect  Mood:Anxious; Dysphoric  Affect:Congruent   Thought Process  Thought Processes:Goal Directed  Descriptions of Associations:Circumstantial  Orientation:Full (Time, Place and Person)  Thought Content:Logical  History of Schizophrenia/Schizoaffective disorder:No data recorded Duration of Psychotic Symptoms:No data recorded Hallucinations:Hallucinations: None  Ideas of Reference:None  Suicidal Thoughts:Suicidal Thoughts: No SI Active Intent and/or Plan: With Intent; With Plan; With Means to Carry Out; With Access to Means  Homicidal Thoughts:Homicidal  Thoughts: No   Sensorium  Memory:Immediate Fair; Recent Good;  Remote Fair  Judgment:Poor  Insight:Shallow   Executive Functions  Concentration:Fair  Attention Span:Fair  Recall:Good  Fund of Knowledge:Poor  Language:Fair   Psychomotor Activity  Psychomotor Activity:Psychomotor Activity: Normal   Assets  Assets:Resilience; Housing   Sleep  Sleep:Sleep: Fair    Physical Exam: Physical Exam HENT:     Head: Normocephalic and atraumatic.  Pulmonary:     Effort: Pulmonary effort is normal.  Neurological:     Mental Status: She is alert and oriented to person, place, and time.   Review of Systems  Psychiatric/Behavioral:  Positive for substance abuse. Negative for hallucinations and suicidal ideas. The patient is nervous/anxious.   Blood pressure 107/71, pulse 69, temperature 98 F (36.7 C), temperature source Oral, resp. rate 16, height 5\' 3"  (1.6 m), weight 91.2 kg, SpO2 98 %. Body mass index is 35.61 kg/m.   COGNITIVE FEATURES THAT CONTRIBUTE TO RISK:  Closed-mindedness    SUICIDE RISK:   Mild:  Suicidal ideation of limited frequency, intensity, duration, and specificity.  There are no identifiable plans on assessment today, no associated intent, mild dysphoria and related symptoms, good self-control (both objective and subjective assessment), few other risk factors, and identifiable protective factors, including available and accessible social support.  PLAN OF CARE: Admit due to suicide attempt and illicit substance use. She needs crisis stabilization, safety monitoring and medication management.      I certify that inpatient services furnished can reasonably be expected to improve the patient's condition.    PGY-2 , MD 08/04/2021, 1:06 PM

## 2021-08-04 NOTE — Progress Notes (Addendum)
D. Pt presented depressed this am, remained in bed and did not eat breakfast or lunch, complaining of a poor appetite. Pt rated her depression, hopelessness and anxiety a 4/5/6, respectively. Pt described her concentration as 'poor', and energy level as 'low'. Pt currently denies SI/HI and AVH Pt said that her goal today was "to eat dinner."  A. Labs and vitals monitored. Pt supported emotionally and encouraged to express concerns and ask questions.   R. Pt remains safe with 15 minute checks. Will continue POC.

## 2021-08-04 NOTE — Progress Notes (Signed)
   08/04/21 2121  Psych Admission Type (Psych Patients Only)  Admission Status Involuntary  Psychosocial Assessment  Patient Complaints Depression  Eye Contact Brief  Facial Expression Flat  Affect Appropriate to circumstance  Speech Logical/coherent  Interaction Assertive  Motor Activity Other (Comment) (WDL)  Appearance/Hygiene In scrubs  Behavior Characteristics Appropriate to situation  Mood Anxious;Labile  Thought Process  Coherency Concrete thinking  Content WDL  Delusions None reported or observed  Perception WDL  Hallucination None reported or observed  Judgment Poor  Confusion None  Danger to Self  Current suicidal ideation? Denies  Danger to Others  Danger to Others None reported or observed

## 2021-08-04 NOTE — Group Note (Signed)
[facility] Group Notes: (Clinical Social Work)   08/04/2021      Type of Therapy:  Group Therapy   Participation Level:  Did Not Attend - was invited both individually by MHT and by overhead announcement, chose not to attend.   Ambrose Mantle, LCSW 08/04/2021, 3:55 PM

## 2021-08-04 NOTE — Progress Notes (Signed)
Patient remained in bed, sleeping on and off, throughout the shift- Pt refused to attend group, refused labs this evening- did not eat dinner, but agreeable to drink Gatorade. Pt provided with personal pitcher of fluids and encouraged to drink.

## 2021-08-04 NOTE — Hospital Course (Addendum)
Brittany Duncan is a 21 yo patient w/ PPH of Bipolar disorder (?), anxiety, polysubstance use,  and ADHD. Patient was transferred from [facility] medical floor to [facility] after SA via OD with 8 Tylenol Extra strength combined with Etoh and Meth in her system. Patient refusing all medications even PRN provided for anxiety. Patient does not want to go to group, but does leave room during free time. Patient denies SI, HI, and AVH daily. Patient future oriented, plan d'c 6/3 unless patient decides to start meds. Have asked GMA talk to patient about taking meds.

## 2021-08-04 NOTE — Progress Notes (Signed)
Pt did not attend wrap-up group   

## 2021-08-04 NOTE — [facility] Counselor (Signed)
[facility] LCSW Note  08/04/2021   11:52 AM  Type of Contact and Topic:  PSA Attempt  CSW made attempts to complete PSA with pt. CSW was unable to meet w/ pt due to pt currently meeting with MD.  CSW will continue efforts to complete PSA at later time.    Leisa Lenz, LCSW 08/04/2021  11:52 AM

## 2021-08-05 DIAGNOSIS — T1491XA Suicide attempt, initial encounter: Secondary | ICD-10-CM

## 2021-08-05 NOTE — Progress Notes (Signed)
D. Pt presented with a brighter affect- improved mood, and reported that she was able to eat a little breakfast this am. Pt was out of her bed upon initial approach reporting that she tended to 'think too much' when she is in bed.  Pt can still become tearful when discussing her sister and mother, but overall much brighter. Pt observed in the milieu interacting appropriately with peers, and attending group. Pt currently denies SI/HI and AVH .  A. Labs and vitals monitored. Pt supported emotionally and encouraged to express concerns and ask questions.   R. Pt remains safe with 15 minute checks. Will continue POC.

## 2021-08-05 NOTE — Progress Notes (Addendum)
[facility] MD Progress Note  08/05/2021 11:06 AM Brittany Duncan  MRN:  [id number] Subjective:  Brittany Duncan is a 21 yo patient w/ PPH of Bipolar disorder (?), anxiety, polysubstance use,  and ADHD. Patient was transferred from [facility] medical floor to [facility] after SA via OD with 8 Tylenol Extra strength combined with Etoh and Meth in her system.   Overnight patient had no behavior events, patient also refused all medications and does not take any PRNs. RN reports that patient appears to have brighter affect today, and patient is refusing labs and groups.  On assessment this AM patient reports that she slept "pretty well" and that her appetite is beginning to come back. Patient reports that overall she feels "better than yesterday" and rates her anxiety a 5/10 and her depression a 5/10 as well. Patient reports that she is able to fel less depressed because she has been distracting herself, successfully by watching TV and talking to others. Patient reports that she feels more "calm" today. Patient reports she is motivated to go home and reports that her plans upon discharge would be to fulfill her community service hours as she does not want to go to jail. Patient reports she intends to return to her grandmothers and has come to the conclusion that she may have to distance herself from her sister to be successful. Patient reports that she does not want to use any illicit substances but is not ready to quit vaping. Patient denies SI, HI, and AVH today. Patient denies cravings for Etoh or meth, but does endorse cravings for nicotine. Patient refuses nicotine patch and gum. Patient has had a good phone call with her grandmother.   Discussion was had with patient about trying PRNs for her anxiety; however patient reports she is scared about starting a medication inpatient when she is convinced she would not be compliant on discharge. Patient reports that she would prefer to not take PRNs as well, but she is aware that they  are available and is more appreciative of this today.   Collateral- Grandmother  Patient provided consent for providers to speak with grandmother. GMA reports that she believes patient is still holding on to the bad parts of her childhood. GMA reports she tries to support patient and does wish patient would take medication and is happy patient will be going back to therapy. GMA endorses support to help patient stay away from illicit substances. GMA reports that she will continue to talk to patient about taking medication. GMA reports that she also think patient may have to separate from her sister to be healthier. GMA report she is also worried about patient's sister.   Objectively, patient does appear a bit less anxious and was seen comfortably talking to another patient in the dayroom this AM.   Principal Problem: Substance induced mood disorder (HCC) Diagnosis: Principal Problem:   Substance induced mood disorder (HCC) Active Problems:   Polysubstance abuse (HCC)   Acetaminophen overdose, intentional self-harm, initial encounter (HCC)   Hypokalemia   Suicide attempt (HCC)   PTSD (post-traumatic stress disorder)  Total Time spent with patient: 20 minutes  Past Psychiatric History: See H&P  Past Medical History:  Past Medical History:  Diagnosis Date   ADHD (attention deficit hyperactivity disorder)    Bipolar disorder (HCC)    Seasonal allergies    History reviewed. No pertinent surgical history. Family History:  Family History  Problem Relation Age of Onset   Drug abuse Mother    Drug abuse  Father    Family Psychiatric  History: See H&P Social History:  Social History   Substance and Sexual Activity  Alcohol Use No     Social History   Substance and Sexual Activity  Drug Use Yes   Types: Methamphetamines, Marijuana    Social History   Socioeconomic History   Marital status: Single    Spouse name: Not on file   Number of children: Not on file   Years of  education: Not on file   Highest education level: Not on file  Occupational History   Not on file  Tobacco Use   Smoking status: Never   Smokeless tobacco: Not on file  Substance and Sexual Activity   Alcohol use: No   Drug use: Yes    Types: Methamphetamines, Marijuana   Sexual activity: Not on file  Other Topics Concern   Not on file  Social History Narrative   Not on file   Social Determinants of Health   Financial Resource Strain: Not on file  Food Insecurity: Not on file  Transportation Needs: Not on file  Physical Activity: Not on file  Stress: Not on file  Social Connections: Not on file   Additional Social History:                         Sleep: Good  Appetite:  Fair  Current Medications: Current Facility-Administered Medications  Medication Dose Route Frequency Provider Last Rate Last Admin   alum & mag hydroxide-simeth (MAALOX/MYLANTA) 200-200-20 MG/5ML suspension 30 mL  30 mL Oral Q4H PRN Starkes-Perry, Juel Burrow, FNP       hydrOXYzine (ATARAX) tablet 25 mg  25 mg Oral TID PRN Bobbitt, Shalon E, NP       hydrOXYzine (ATARAX) tablet 25 mg  25 mg Oral TID PRN Bobbye Morton, MD       hydrOXYzine (ATARAX) tablet 25 mg  25 mg Oral Q6H PRN Eliseo Gum B, MD       loperamide (IMODIUM) capsule 2-4 mg  2-4 mg Oral PRN Eliseo Gum B, MD       ziprasidone (GEODON) injection 20 mg  20 mg Intramuscular Q12H PRN Starkes-Perry, Juel Burrow, FNP       And   LORazepam (ATIVAN) tablet 1 mg  1 mg Oral PRN Starkes-Perry, Juel Burrow, FNP       LORazepam (ATIVAN) tablet 1 mg  1 mg Oral Q6H PRN Eliseo Gum B, MD       magnesium hydroxide (MILK OF MAGNESIA) suspension 30 mL  30 mL Oral Daily PRN Starkes-Perry, Juel Burrow, FNP       multivitamin with minerals tablet 1 tablet  1 tablet Oral Daily Cheila Wickstrom B, MD       nicotine polacrilex (NICORETTE) gum 2 mg  2 mg Oral PRN Eliseo Gum B, MD       ondansetron (ZOFRAN-ODT) disintegrating tablet 4 mg  4 mg Oral Q6H PRN  Eliseo Gum B, MD       pantoprazole (PROTONIX) EC tablet 40 mg  40 mg Oral Daily PRN Eliseo Gum B, MD       QUEtiapine (SEROQUEL) tablet 50 mg  50 mg Oral QHS PRN Eliseo Gum B, MD       thiamine (B-1) injection 100 mg  100 mg Intramuscular Once Eliseo Gum B, MD       thiamine tablet 100 mg  100 mg Oral Daily Bobbye Morton, MD  traZODone (DESYREL) tablet 50 mg  50 mg Oral QHS PRN Bobbitt, Shalon E, NP        Lab Results:  Results for orders placed or performed during the hospital encounter of 08/02/21 (from the past 48 hour(s))  SARS Coronavirus 2 by RT PCR (hospital order, performed in [facility] hospital lab) *cepheid single result test* Anterior Nasal Swab     Status: None   Collection Time: 08/03/21 12:07 PM   Specimen: Anterior Nasal Swab  Result Value Ref Range   SARS Coronavirus 2 by RT PCR NEGATIVE NEGATIVE    Comment: (NOTE) SARS-CoV-2 target nucleic acids are NOT DETECTED.  The SARS-CoV-2 RNA is generally detectable in upper and lower respiratory specimens during the acute phase of infection. The lowest concentration of SARS-CoV-2 viral copies this assay can detect is 250 copies / mL. A negative result does not preclude SARS-CoV-2 infection and should not be used as the sole basis for treatment or other patient management decisions.  A negative result may occur with improper specimen collection / handling, submission of specimen other than nasopharyngeal swab, presence of viral mutation(s) within the areas targeted by this assay, and inadequate number of viral copies (<250 copies / mL). A negative result must be combined with clinical observations, patient history, and epidemiological information.  Fact Sheet for Patients:   RoadLapTop.co.zahttps://www.fda.gov/media/158405/download  Fact Sheet for Healthcare Providers: http://kim-miller.com/https://www.fda.gov/media/158404/download  This test is not yet approved or  cleared by the [place] FDA and has been authorized for detection  and/or diagnosis of SARS-CoV-2 by FDA under an Emergency Use Authorization (EUA).  This EUA will remain in effect (meaning this test can be used) for the duration of the COVID-19 declaration under Section 564(b)(1) of the Act, 21 U.S.C. section 360bbb-3(b)(1), unless the authorization is terminated or revoked sooner.  Performed at [facility], [address] [place], [place] [address]     Blood Alcohol level:  Lab Results  Component Value Date   ETH <10 08/02/2021    Metabolic Disorder Labs: No results found for: HGBA1C, MPG No results found for: PROLACTIN No results found for: CHOL, TRIG, HDL, CHOLHDL, VLDL, LDLCALC  Physical Findings: AIMS: Facial and Oral Movements Muscles of Facial Expression: None, normal Lips and Perioral Area: None, normal Jaw: None, normal Tongue: None, normal,Extremity Movements Upper (arms, wrists, hands, fingers): None, normal Lower (legs, knees, ankles, toes): None, normal, Trunk Movements Neck, shoulders, hips: None, normal, Overall Severity Severity of abnormal movements (highest score from questions above): None, normal Incapacitation due to abnormal movements: None, normal Patient's awareness of abnormal movements (rate only patient's report): No Awareness, Dental Status Current problems with teeth and/or dentures?: Yes Does patient usually wear dentures?: No  CIWA:  CIWA-Ar Total: 0 COWS:     Musculoskeletal: Strength & Muscle Tone: within normal limits Gait & Station: normal Patient leans: N/A  Psychiatric Specialty Exam:  Presentation  General Appearance: Appropriate for Environment; Casual  Eye Contact:Fleeting  Speech:Clear and Coherent  Speech Volume:Normal  Handedness:Right   Mood and Affect  Mood:-- ("calm")  Affect:Appropriate   Thought Process  Thought Processes:Linear  Descriptions of Associations:Circumstantial  Orientation:Full (Time, Place and Person)  Thought  Content:Logical  History of Schizophrenia/Schizoaffective disorder:No data recorded Duration of Psychotic Symptoms:No data recorded Hallucinations:Hallucinations: None  Ideas of Reference:None  Suicidal Thoughts:Suicidal Thoughts: No  Homicidal Thoughts:Homicidal Thoughts: No   Sensorium  Memory:Remote Fair; Immediate Fair; Recent Fair  Judgment:Poor  Insight:Shallow   Executive Functions  Concentration:Fair  Attention Span:Fair  Recall:Good  Fund of Knowledge:Fair  Language:Fair   Psychomotor Activity  Psychomotor Activity:Psychomotor Activity: Normal   Assets  Assets:Resilience; Housing   Sleep  Sleep:Sleep: Good    Physical Exam: Physical Exam HENT:     Head: Normocephalic and atraumatic.  Pulmonary:     Effort: Pulmonary effort is normal.  Neurological:     Mental Status: She is alert and oriented to person, place, and time.   Review of Systems  Neurological:  Positive for dizziness.       Says she will get dizzy upon standing, but this is common for her  Psychiatric/Behavioral:  Negative for hallucinations and suicidal ideas. The patient does not have insomnia.   Blood pressure 105/77, pulse (!) 59, temperature 98.2 F (36.8 C), temperature source Oral, resp. rate 16, height 5\' 3"  (1.6 m), weight 91.2 kg, SpO2 99 %. Body mass index is 35.61 kg/m.   Treatment Plan Summary: Daily contact with patient to assess and evaluate symptoms and progress in treatment and Medication management  Para Cossey is a 21 yo patient w/ PPH of Bipolar disorder (?), anxiety, polysubstance use,  and ADHD. Patient was transferred from [facility] medical floor to [facility] after SA via OD with 8 Tylenol Extra strength combined with Etoh and Meth in her system.   Patient appeared to have brighter affect and a bit less anxious today, even when discussing topics that triggered her anxiety yesterday (medication options). Patient remains adamant that she does not want medication.  Patient continues to deny SI and appears to be future oriented. Despite not attending groups, patient does spend free time in the day room and is interacting with some other patient's on the unit which is encouraging. Patient's GMA has endorsed support for the patient to continue therapy OP and support overall for patient. Providers continue to provide PRN's for patient if needed and have advised patient about this. Again patient's attempt was more impulsive and patient has a hx of impulsivity. Patient would benefit from medication for this reason.     Labs: CMP: K 3.4, CBC- WNL, Phos- WNL, Pt- INR: WNL, Acetaminophen lvl: 57> <10, UDS: + THC and meth, B- hcg: (-),  Pending- A1c, Lipid, TSH, repeat K+ (refused labs due to hx of being a hard stick) EKG: QTC 419, NSR Safety: Routine, q 15 min checks   Substance Induced Mood Disorder  Cannabis use disorder, severe Stimulant use disorder, severe, methamphetamines Cocaine abuse - Continue Seroquel 50mg  QHS PRN (for insomnia, mood instability, impulsivity) patient does not want scheduled meds   Social Anxiety PTSD - Atarax 25mg  TID PRN     Etoh Use - CIWA w/ PRNs, patient endorse hx of excessive drinking. Pt had Etoh in system on day of OD.  - CIWA 6/4- 0     Tobacco use disorder - Nicorette gum PRN    PGY-2 , MD 08/05/2021, 11:06 AM

## 2021-08-05 NOTE — Progress Notes (Signed)
Pt reports no further diarrhea or stomach symptoms.  Explained to Pt that we will need a stool sample per order and to let staff know if she has another bowel movement.  Hat in place in toilet.  Pt verbalized understanding.     08/05/21 2020  Psych Admission Type (Psych Patients Only)  Admission Status Involuntary  Psychosocial Assessment  Patient Complaints Depression  Eye Contact Brief  Facial Expression Flat  Affect Appropriate to circumstance  Speech Logical/coherent  Interaction Assertive  Motor Activity Other (Comment) (WDL)  Appearance/Hygiene In scrubs  Behavior Characteristics Appropriate to situation  Mood Anxious;Labile  Thought Process  Coherency Concrete thinking  Content WDL  Delusions None reported or observed  Perception WDL  Hallucination None reported or observed  Judgment Poor  Confusion None  Danger to Self  Current suicidal ideation? Denies  Danger to Others  Danger to Others None reported or observed

## 2021-08-05 NOTE — Progress Notes (Signed)
Pt did not attend wrap-up group   

## 2021-08-05 NOTE — [facility] Suicide Risk Assessment (Signed)
[facility] INPATIENT:  Family/Significant Other Suicide Prevention Education  Suicide Prevention Education:  Education Completed; Edwyna Perfect [phone], has been identified by the patient as the family member/significant other with whom the patient will be residing, and identified as the person(s) who will aid the patient in the event of a mental health crisis (suicidal ideations/suicide attempt).  With written consent from the patient, the family member/significant other has been provided the following suicide prevention education, prior to the and/or following the discharge of the patient.  The suicide prevention education provided includes the following: Suicide risk factors Suicide prevention and interventions National Suicide Hotline telephone number [facility] assessment telephone number [facility] Emergency Assistance [place] and/or Residential Mobile Crisis Unit telephone number  Request made of family/significant other to: Remove weapons (e.g., guns, rifles, knives), all items previously/currently identified as safety concern.   Remove drugs/medications (over-the-counter, prescriptions, illicit drugs), all items previously/currently identified as a safety concern.  The family member/significant other verbalizes understanding of the suicide prevention education information provided.  The family member/significant other agrees to remove the items of safety concern listed above. Horris Latino stated that all weapons were locked up in a safe at home and that [place] did not have access. In addition, she stated that medications in the home and been removed from access and hidden.  Darrick Meigs T Yovanna Cogan LCSWA 08/05/2021, 11:59 AM
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
## 2021-08-05 NOTE — Progress Notes (Signed)
Patient reported having an episode of diarrhea, stating, "I believe I ate something that didn't agree with me." MD made aware. GI panel per MD verbal order.

## 2021-08-05 NOTE — Group Note (Signed)
[facility] LCSW Group Therapy Note  08/05/2021    Type of Therapy and Topic:  Group Therapy:  Adding Supports Including Yourself  Participation Level:  Minimal   Description of Group:   Patients in this group were introduced to the concept that additional supports including self-support are an essential part of recovery.  Patients listed their current healthy and unhealthy supports, and discussed the difference between the two.   Several songs were played and a group discussion ensued in which patients stated they could relate to the songs which inspired them to realize they have be willing to help themselves in order to succeed, because other people cannot achieve sobriety or stability for them.  Parents were encouraged toward self-advocacy and self-support as part of their recovery.  They discussed their reactions to these songs' messages, which were positive and hopeful.  Before group ended, they identified the supports they believe they need to add to their lives to achieve their goals at discharge.   Therapeutic Goals: 1)  explain the difference between healthy and unhealthy supports and discuss what specific supports are currently in patients' lives 2)  demonstrate the importance of being a key part of one's own support system 3)  discuss the need for appropriate boundaries with supports 4)  elicit ideas from patients about supports that need to be added in order to achieve goals   Summary of Patient Progress:   The patient only spoke when called on directly in group.  She appeared severely depressed.  She stated that coping skills do not help her and talking about her problems does not help.  She stated that going to other people just gives them the opportunity to use the information you share against you.  She does however know who she can talk to.  She stated that what she wants to go is get away from [place].   Therapeutic Modalities:   Motivational Interviewing Activity  Lynnell Chad

## 2021-08-06 ENCOUNTER — Encounter (HOSPITAL_COMMUNITY): Payer: Self-pay

## 2021-08-06 DIAGNOSIS — F1994 Other psychoactive substance use, unspecified with psychoactive substance-induced mood disorder: Secondary | ICD-10-CM

## 2021-08-06 LAB — TSH: TSH: 0.778 u[IU]/mL (ref 0.350–4.500)

## 2021-08-06 LAB — LIPID PANEL
Cholesterol: 209 mg/dL — ABNORMAL HIGH (ref 0–200)
HDL: 27 mg/dL — ABNORMAL LOW (ref >40)
LDL Cholesterol: 147 mg/dL — ABNORMAL HIGH (ref 0–99)
Total CHOL/HDL Ratio: 7.7 RATIO
Triglycerides: 173 mg/dL — ABNORMAL HIGH (ref <150)
VLDL: 35 mg/dL (ref 0–40)

## 2021-08-06 LAB — HEMOGLOBIN A1C
Hgb A1c MFr Bld: 5 % (ref 4.8–5.6)
Mean Plasma Glucose: 96.8 mg/dL

## 2021-08-06 NOTE — BH IP Treatment Plan (Signed)
Interdisciplinary Treatment and Diagnostic Plan Update  08/06/2021 Time of Session: 1000 Evangelyne Loja MRN: [id number]  Principal Diagnosis: Substance induced mood disorder (HCC)  Secondary Diagnoses: Principal Problem:   Substance induced mood disorder (HCC) Active Problems:   Polysubstance abuse (HCC)   Acetaminophen overdose, intentional self-harm, initial encounter (HCC)   Hypokalemia   Suicide attempt (HCC)   PTSD (post-traumatic stress disorder)   Current Medications:  Current Facility-Administered Medications  Medication Dose Route Frequency Provider Last Rate Last Admin   alum & mag hydroxide-simeth (MAALOX/MYLANTA) 200-200-20 MG/5ML suspension 30 mL  30 mL Oral Q4H PRN Starkes-Perry, Juel Burrow, FNP       hydrOXYzine (ATARAX) tablet 25 mg  25 mg Oral TID PRN Bobbitt, Shalon E, NP       hydrOXYzine (ATARAX) tablet 25 mg  25 mg Oral TID PRN Bobbye Morton, MD       hydrOXYzine (ATARAX) tablet 25 mg  25 mg Oral Q6H PRN Eliseo Gum B, MD       loperamide (IMODIUM) capsule 2-4 mg  2-4 mg Oral PRN Eliseo Gum B, MD       ziprasidone (GEODON) injection 20 mg  20 mg Intramuscular Q12H PRN Starkes-Perry, Juel Burrow, FNP       And   LORazepam (ATIVAN) tablet 1 mg  1 mg Oral PRN Starkes-Perry, Juel Burrow, FNP       LORazepam (ATIVAN) tablet 1 mg  1 mg Oral Q6H PRN Eliseo Gum B, MD       magnesium hydroxide (MILK OF MAGNESIA) suspension 30 mL  30 mL Oral Daily PRN Starkes-Perry, Juel Burrow, FNP       multivitamin with minerals tablet 1 tablet  1 tablet Oral Daily Eliseo Gum B, MD   1 tablet at 08/06/21 0741   nicotine polacrilex (NICORETTE) gum 2 mg  2 mg Oral PRN Bobbye Morton, MD       ondansetron (ZOFRAN-ODT) disintegrating tablet 4 mg  4 mg Oral Q6H PRN Eliseo Gum B, MD       pantoprazole (PROTONIX) EC tablet 40 mg  40 mg Oral Daily PRN Eliseo Gum B, MD       QUEtiapine (SEROQUEL) tablet 50 mg  50 mg Oral QHS PRN Eliseo Gum B, MD       thiamine (B-1) injection 100 mg   100 mg Intramuscular Once Eliseo Gum B, MD       thiamine tablet 100 mg  100 mg Oral Daily Eliseo Gum B, MD   100 mg at 08/06/21 0741   traZODone (DESYREL) tablet 50 mg  50 mg Oral QHS PRN Bobbitt, Shalon E, NP       PTA Medications: No medications prior to admission.    Patient Stressors: Financial difficulties   Marital or family conflict   Substance abuse    Patient Strengths: Supportive family/friends   Treatment Modalities: Medication Management, Group therapy, Case management,  1 to 1 session with clinician, Psychoeducation, Recreational therapy.   Physician Treatment Plan for Primary Diagnosis: Substance induced mood disorder (HCC) Long Term Goal(s): Improvement in symptoms so as ready for discharge   Short Term Goals: Ability to identify changes in lifestyle to reduce recurrence of condition will improve Ability to verbalize feelings will improve Ability to disclose and discuss suicidal ideas Ability to demonstrate self-control will improve Ability to identify and develop effective coping behaviors will improve Ability to maintain clinical measurements within normal limits will improve Compliance with prescribed medications will improve Ability to identify triggers  associated with substance abuse/mental health issues will improve  Medication Management: Evaluate patient's response, side effects, and tolerance of medication regimen.  Therapeutic Interventions: 1 to 1 sessions, Unit Group sessions and Medication administration.  Evaluation of Outcomes: Adequate for Discharge  Physician Treatment Plan for Secondary Diagnosis: Principal Problem:   Substance induced mood disorder (HCC) Active Problems:   Polysubstance abuse (HCC)   Acetaminophen overdose, intentional self-harm, initial encounter (HCC)   Hypokalemia   Suicide attempt (HCC)   PTSD (post-traumatic stress disorder)  Long Term Goal(s): Improvement in symptoms so as ready for discharge   Short Term  Goals: Ability to identify changes in lifestyle to reduce recurrence of condition will improve Ability to verbalize feelings will improve Ability to disclose and discuss suicidal ideas Ability to demonstrate self-control will improve Ability to identify and develop effective coping behaviors will improve Ability to maintain clinical measurements within normal limits will improve Compliance with prescribed medications will improve Ability to identify triggers associated with substance abuse/mental health issues will improve     Medication Management: Evaluate patient's response, side effects, and tolerance of medication regimen.  Therapeutic Interventions: 1 to 1 sessions, Unit Group sessions and Medication administration.  Evaluation of Outcomes: Adequate for Discharge   RN Treatment Plan for Primary Diagnosis: Substance induced mood disorder (HCC) Long Term Goal(s): Knowledge of disease and therapeutic regimen to maintain health will improve  Short Term Goals: Ability to remain free from injury will improve, Ability to verbalize frustration and anger appropriately will improve, Ability to demonstrate self-control, Ability to participate in decision making will improve, Ability to verbalize feelings will improve, Ability to disclose and discuss suicidal ideas, Ability to identify and develop effective coping behaviors will improve, and Compliance with prescribed medications will improve  Medication Management: RN will administer medications as ordered by provider, will assess and evaluate patient's response and provide education to patient for prescribed medication. RN will report any adverse and/or side effects to prescribing provider.  Therapeutic Interventions: 1 on 1 counseling sessions, Psychoeducation, Medication administration, Evaluate responses to treatment, Monitor vital signs and CBGs as ordered, Perform/monitor CIWA, COWS, AIMS and Fall Risk screenings as ordered, Perform wound  care treatments as ordered.  Evaluation of Outcomes: Adequate for Discharge   LCSW Treatment Plan for Primary Diagnosis: Substance induced mood disorder (HCC) Long Term Goal(s): Safe transition to appropriate next level of care at discharge, Engage patient in therapeutic group addressing interpersonal concerns.  Short Term Goals: Engage patient in aftercare planning with referrals and resources, Increase social support, Increase ability to appropriately verbalize feelings, Increase emotional regulation, Facilitate acceptance of mental health diagnosis and concerns, Facilitate patient progression through stages of change regarding substance use diagnoses and concerns, Identify triggers associated with mental health/substance abuse issues, and Increase skills for wellness and recovery  Therapeutic Interventions: Assess for all discharge needs, 1 to 1 time with Social worker, Explore available resources and support systems, Assess for adequacy in community support network, Educate family and significant other(s) on suicide prevention, Complete Psychosocial Assessment, Interpersonal group therapy.  Evaluation of Outcomes: Adequate for Discharge   Progress in Treatment: Attending groups: No. Participating in groups: No. Taking medication as prescribed: Yes. Toleration medication: Yes. Family/Significant other contact made: Yes, individual(s) contacted:  SPE completed with Charlean Sanfilippo. Patient understands diagnosis: Yes. Discussing patient identified problems/goals with staff: Yes. Medical problems stabilized or resolved: Yes. Denies suicidal/homicidal ideation: Yes. Issues/concerns per patient self-inventory: Yes. Other: none   New problem(s) identified: No, Describe:  none   New  Short Term/Long Term Goal(s): Patient to work towards detox, medication management for mood stabilization; elimination of SI thoughts; development of comprehensive mental wellness/sobriety plan.  Patient Goals:   Patient simply states her goal is to "go home"   Discharge Plan or Barriers: No psychosocial barriers identified at this time, patient to return to place of residence when appropriate for discharge.   Reason for Continuation of Hospitalization: Depression  Estimated Length of Stay: Patient anticipated to discharge on this day   Scribe for Treatment Team: Almedia BallsMichael W Imani Fiebelkorn, LCSWA 08/06/2021 10:33 AM

## 2021-08-06 NOTE — Progress Notes (Signed)
  [facility] Adult Case Management Discharge Plan :  Will you be returning to the same living situation after discharge:  Yes,  Home with Grandparents  At discharge, do you have transportation home?: Yes,  Grandmother  Do you have the ability to pay for your medications: Yes,  Medicaid   Release of information consent forms completed and in the chart;  Patient's signature needed at discharge.  Patient to Follow up at:  Follow-up Information     [facility]   Contact information: [address] [place] [address] [phone]                 Next level of care provider has access to [facility]:no  Safety Planning and Suicide Prevention discussed: Yes,  with patient and grandmother      Has patient been referred to the Quitline?: Patient refused referral Pt reports she uses a Vape but does not wish to be referred to the Quitline at this time.    Patient has been referred for addiction treatment: Pt. refused referral  Aram Beecham, LCSWA 08/06/2021, 9:23 AM

## 2021-08-06 NOTE — Progress Notes (Signed)
No loose stools tonight, Pt exhibiting no symptoms of virus.  Pt allowed blood draw this morning without incident in her room.  Pt pleasant and cooperative.  Pt states that loose stool yesterday was due to her lactulose intolerance.

## 2021-08-06 NOTE — BHH Suicide Risk Assessment (Cosign Needed Addendum)
Suicide Risk Assessment  Discharge Assessment    [facility] Discharge Suicide Risk Assessment   Principal Problem: Substance induced mood disorder (HCC) Discharge Diagnoses: Principal Problem:   Substance induced mood disorder (HCC) Active Problems:   Polysubstance abuse (HCC)   Acetaminophen overdose, intentional self-harm, initial encounter (HCC)   Hypokalemia   Suicide attempt (HCC)   PTSD (post-traumatic stress disorder)  Reason For Admission: Brittany Duncan is a 21 yo patient w/ PPH of Bipolar disorder (?), anxiety, polysubstance use,  and ADHD. Patient was transferred from [facility] medical floor to [facility] after SA via OD with 8 Tylenol Extra strength combined with Etoh and Meth in her system.                              HOSPITAL COURSE  During the patient's hospitalization, patient had extensive initial psychiatric evaluation, and follow-up psychiatric evaluations every day.  Psychiatric diagnoses provided & medications started upon initial assessment were as follows:  - Started Seroquel 50mg  QHS PRN (for insomnia, mood instability, impulsivity)   - Atarax 25mg  TID PRN for anxiety    Etoh Use - Started Ativan 1 mg PRN based on CIWA scores for alcohol abuse   - Started Nicorette gum PRN for nicotine dependence Patient has refused medications since admission and is not open to being discharged on any medications. She will not be discharged home on medications.   During the hospitalization, no other adjustments were made to the above medications, as pt refused to take medications during the course of this hospitalization. She has refused medication management of her symptoms. Patient's care was discussed during the interdisciplinary team meeting every day during the hospitalization. Gradually, patient started adjusting to milieu. The patient was evaluated each day by a clinical provider to ascertain response to treatment. Improvement was noted by the patient's report of decreasing symptoms,  improved sleep and appetite, affect, medication tolerance, behavior, and participation in unit programming.  Patient was asked each day to complete a self inventory noting mood, mental status, pain, new symptoms, anxiety and concerns.    Symptoms were reported as significantly decreased or resolved completely by discharge. On day of discharge, the patient reports that their mood is stable. The patient denied having suicidal thoughts for more than 48 hours prior to discharge.  Patient denies having homicidal thoughts.  Patient denies having auditory hallucinations.  Patient denies any visual hallucinations or other symptoms of psychosis. The patient was motivated to continue taking medication with a goal of continued improvement in mental health.   The patient reports their target psychiatric symptoms of depression responded well & is resolving since being hospitalized at this [facility], and she reports overall benefit from this psychiatric hospitalization. Supportive psychotherapy was provided to the patient. The patient also participated in regular group therapy while hospitalized. Coping skills, problem solving as well as relaxation therapies were part of the unit programming, and the pt reports benefiting of these. She has not taken any medications since being hospitalized, and is not open to being discharged home with any medications. She reports an appointment with her Psychiatrist on 6/29 at 2 pm that was made prior to this hospitalization. She reports that she will be following up there. She reports that she will put into practice coping mechanisms that she learned here at [facility], and plans to start therapy where she can talk about her stressors rather than using substances of abuse. She also states that she has 30  hrs of community service & 6 months of unsupervised probation in [place] that she plans on completing for possession of marijuana and resisting arrest.   Labs were reviewed with the patient, and  abnormal results were discussed with the patient. Potassium is  slightly low at 3.4. Pt has been educated to eat potassium rich foods. Cholesterol is high at 209, HDL is low at 27, LDL is 147 and Triglycerides are 173. Pt has been educated on the need to make healthy food choices and to do daily exercises. She has also been educated to follow these medical concerns up with her primary care provider and has verbalized understanding.  Total Time spent with patient: 30 minutes  Musculoskeletal: Strength & Muscle Tone: within normal limits Gait & Station: normal Patient leans: N/A  Psychiatric Specialty Exam  Presentation  General Appearance: Appropriate for Environment; Fairly Groomed  Eye Contact:Fleeting  Speech:Clear and Coherent  Speech Volume:Normal  Handedness:Right  Mood and Affect  Mood:Euthymic  Duration of Depression Symptoms: No data recorded Affect:Appropriate; Congruent  Thought Process  Thought Processes:Coherent  Descriptions of Associations:Intact  Orientation:Full (Time, Place and Person)  Thought Content:Logical  History of Schizophrenia/Schizoaffective disorder:No data recorded Duration of Psychotic Symptoms:No data recorded Hallucinations:Hallucinations: None  Ideas of Reference:None  Suicidal Thoughts:Suicidal Thoughts: No  Homicidal Thoughts:Homicidal Thoughts: No  Sensorium  Memory:Immediate Good; Recent Good  Judgment:Good  Insight:Good  Executive Functions  Concentration:Good  Attention Span:Good  Recall:Good  Fund of Knowledge:Good  Language:Good  Psychomotor Activity  Psychomotor Activity:Psychomotor Activity: Normal  Assets  Assets:Communication Skills  Sleep  Sleep:Sleep: Good  Physical Exam: Physical Exam Constitutional:      Appearance: Normal appearance.  HENT:     Head: Normocephalic.     Nose: No congestion or rhinorrhea.  Eyes:     Pupils: Pupils are equal, round, and reactive to light.  Pulmonary:      Effort: Pulmonary effort is normal.  Musculoskeletal:        General: Normal range of motion.     Cervical back: Normal range of motion.  Neurological:     General: No focal deficit present.     Mental Status: She is alert.  Psychiatric:        Behavior: Behavior normal.        Thought Content: Thought content normal.   Review of Systems  Constitutional: Negative.  Negative for fever.  HENT: Negative.  Negative for sore throat.   Eyes: Negative.   Respiratory: Negative.  Negative for cough and shortness of breath.   Cardiovascular: Negative.  Negative for chest pain.  Gastrointestinal: Negative.  Negative for heartburn.  Genitourinary: Negative.   Musculoskeletal: Negative.   Skin: Negative.   Neurological: Negative.   Psychiatric/Behavioral:  Positive for depression (Pt reports that her depressive symptoms are resolving, and she currently denies SI/HI/AVH and verbally contracts for safety outside of this [facility]) and substance abuse (Educated on the need to abstain from substances of abuse). Negative for hallucinations, memory loss and suicidal ideas. The patient is not nervous/anxious and does not have insomnia.   Blood pressure 110/69, pulse (!) 58, temperature 98 F (36.7 C), temperature source Oral, resp. rate 15, height 5\' 3"  (1.6 m), weight 91.2 kg, SpO2 99 %. Body mass index is 35.61 kg/m.  Mental Status Per Nursing Assessment::   On Admission:  Self-harm behaviors  Demographic Factors:  Adolescent or young adult and Low socioeconomic status  Loss Factors: NA  Historical Factors: Impulsivity  Risk Reduction Factors:  Living with another person, especially a relative  Continued Clinical Symptoms:  Pt reports that her depressive symptoms have resolved, and she currently denies SI/HI/AVH and verbally contracts for safety outside [facility].  Cognitive Features That Contribute To Risk:  None    Suicide Risk:  Minimal: No identifiable suicidal ideation.   Patients presenting with no risk factors but with morbid ruminations; may be classified as minimal risk based on the severity of the depressive symptoms.   Follow-up Information     [facility] [employer]   Contact information: [address] [place] [address] [phone]                  Plan Of Care/Follow-up recommendations:  The patient is able to verbalize their individual safety plan to this provider.  # It is recommended to the patient to continue psychiatric medications as prescribed, after discharge from the hospital.    # It is recommended to the patient to follow up with your outpatient psychiatric provider and PCP.  # It was discussed with the patient, the impact of alcohol, drugs, tobacco have been there overall psychiatric and medical wellbeing, and total abstinence from substance use was recommended the patient.ed.  # Prescriptions provided or sent directly to preferred pharmacy at discharge. Patient agreeable to plan. Given opportunity to ask questions. Appears to feel comfortable with discharge.    # In the event of worsening symptoms, the patient is instructed to call the crisis hotline, 911 and or go to the nearest ED for appropriate evaluation and treatment of symptoms. To follow-up with primary care provider for other medical issues, concerns and or health care needs  # Patient was discharged home with a plan to [facility] as noted above. She has an appointment with this provider on  08/30/2021 at 2 pm.  Starleen Blueoris  Lacheryl Niesen, NP 08/06/2021, 10:53 AM

## 2021-08-06 NOTE — Progress Notes (Signed)
Pt discharged to lobby where grandmother is waiting. Pt was stable and appreciative at that time. All papers were given and valuables returned. Follow up reviewed with patient. Verbal understanding expressed. Denies SI/HI and A/VH. Pt given opportunity to express concerns and ask questions.

## 2021-08-06 NOTE — B[facility] Group Notes (Signed)
  Spiritual care group on grief and loss facilitated by chaplain Janne Napoleon, [facility]   Group Goal:   Support / Education around grief and loss   Members engage in facilitated group support and psycho-social education.   Group Description:   Following introductions and group rules, group members engaged in facilitated group dialog and support around topic of loss, with particular support around experiences of loss in their lives. Group Identified types of loss (relationships / self / things) and identified patterns, circumstances, and changes that precipitate losses. Reflected on thoughts / feelings around loss, normalized grief responses, and recognized variety in grief experience. Group noted Worden's four tasks of grief in discussion.   Group drew on Adlerian / Rogerian, narrative, MI,   Patient Progress: Brittany Duncan was present at the beginning of group but was discharged shortly into the group conversation.  She became tearful when she saw her grandmother approaching and stated that she was having a panic attack.  Chaplains encouraged doing what she needed to care for herself during her panic attack.    [address] [place] Pager, [phone]

## 2021-08-06 NOTE — Progress Notes (Signed)
D. Pt presents as anxious, but appropriate, reported that she is looking forward to discharging today. Pt denied viral symptoms , pain, withdrawal symptoms and denied SI/HI and AVH  A. Labs and vitals monitored. . Pt supported emotionally and encouraged to express concerns and ask questions.   R. Pt remains safe with 15 minute checks. Will continue POC.

## 2021-08-06 NOTE — Discharge Summary (Signed)
Physician Discharge Summary Note  Patient:  Brittany Duncan is an [age] y.o., female MRN:  [id number] DOB:  2000-07-29 Patient phone:  [phone] (home)  Patient address:   [address] [place] [address],  Total Time spent with patient: 30 minutes  Date of Admission:  08/03/2021 Date of Discharge: 08/06/2021  Reason for Admission: Brittany BrunswickMadison Storr is a 21 yo patient w/ PPH of Bipolar disorder (?), anxiety, polysubstance use,  and ADHD. [facility] OD with 8 Tylenol Extra strength combined with Etoh and Meth in her system.   Principal Problem: Substance induced mood disorder [facility](HCC) Discharge Diagnoses: Principal Problem:   Substance induced mood disorder (HCC) Active Problems:   Polysubstance abuse (HCC)   Acetaminophen overdose, intentional self-harm, initial encounter (HCC)   Hypokalemia   Suicide attempt (HCC)   PTSD (post-traumatic stress disorder)  Past Psychiatric History: As above  Past Medical History:  Past Medical History:  Diagnosis Date   ADHD (attention deficit hyperactivity disorder)    Bipolar disorder (HCC)    Seasonal allergies    History reviewed. No pertinent surgical history. Family History:  Family History  Problem Relation Age of Onset   Drug abuse Mother    Drug abuse Father    Family Psychiatric  History: as above Social History:  Social History   Substance and Sexual Activity  Alcohol Use No     Social History   Substance and Sexual Activity  Drug Use Yes   Types: Methamphetamines, Marijuana    Social History   Socioeconomic History   Marital status: Single    Spouse name: Not on file   Number of children: Not on file   Years of education: Not on file   Highest education level: Not on file  Occupational History   Not on file  Tobacco Use   Smoking status: Never   Smokeless tobacco: Not on file  Substance and Sexual Activity   Alcohol use: No   Drug use: Yes    Types:  Methamphetamines, Marijuana   Sexual activity: Not on file  Other Topics Concern   Not on file  Social History Narrative   Not on file   Social Determinants of Health   Financial Resource Strain: Not on file  Food Insecurity: Not on file  Transportation Needs: Not on file  Physical Activity: Not on file  Stress: Not on file  Social Connections: Not on file                                   HOSPITAL COURSE  During the patient's hospitalization, patient had extensive initial psychiatric evaluation, and follow-up psychiatric evaluations every day.  Psychiatric diagnoses provided & medications started upon initial assessment were as follows:   - Started Seroquel 50mg  QHS PRN (for insomnia, mood instability, impulsivity)    - Atarax 25mg  TID PRN for anxiety    Etoh Use - Started Ativan 1 mg PRN based on CIWA scores for alcohol abuse   - Started Nicorette gum PRN for nicotine dependence Patient has refused medications since admission and is not open to being discharged on any medications. She will not be discharged home on medications.   During the hospitalization, no other adjustments were made to the above medications, as pt refused to take medications during the course of this hospitalization. She has refused medication management of her symptoms. Patient's care  was discussed during the interdisciplinary team meeting every day during the hospitalization. Gradually, patient started adjusting to milieu. The patient was evaluated each day by a clinical provider to ascertain response to treatment. Improvement was noted by the patient's report of decreasing symptoms, improved sleep and appetite, affect, medication tolerance, behavior, and participation in unit programming.  Patient was asked each day to complete a self inventory noting mood, mental status, pain, new symptoms, anxiety and concerns.     Symptoms were reported as significantly decreased or resolved completely by discharge. On day  of discharge, the patient reports that their mood is stable. The patient denied having suicidal thoughts for more than 48 hours prior to discharge.  Patient denies having homicidal thoughts.  Patient denies having auditory hallucinations.  Patient denies any visual hallucinations or other symptoms of psychosis. The patient was motivated to continue taking medication with a goal of continued improvement in mental health.    The patient reports their target psychiatric symptoms of depression responded well & is resolving since being hospitalized at this [facility], and she reports overall benefit from this psychiatric hospitalization. Supportive psychotherapy was provided to the patient. The patient also participated in regular group therapy while hospitalized. Coping skills, problem solving as well as relaxation therapies were part of the unit programming, and the pt reports benefiting of these. She has not taken any medications since being hospitalized, and is not open to being discharged home with any medications. She reports an appointment with her Psychiatrist on 6/29 at 2 pm that was made prior to this hospitalization. She reports that she will be following up there. She reports that she will put into practice coping mechanisms that she learned here at [facility], and plans to start therapy where she can talk about her stressors rather than using substances of abuse. She also states that she has 30 hrs of community service & 6 months of unsupervised probation in [place] that she plans on completing for possession of marijuana and resisting arrest.    Labs were reviewed with the patient, and abnormal results were discussed with the patient. Potassium is  slightly low at 3.4. Pt has been educated to eat potassium rich foods. Cholesterol is high at 209, HDL is low at 27, LDL is 147 and Triglycerides are 173. Pt has been educated on the need to make healthy food choices and to do daily exercises. She has also been  educated to follow these medical concerns up with her primary care provider and has verbalized understanding.   Physical Findings: AIMS: Facial and Oral Movements Muscles of Facial Expression: None, normal Lips and Perioral Area: None, normal Jaw: None, normal Tongue: None, normal,Extremity Movements Upper (arms, wrists, hands, fingers): None, normal Lower (legs, knees, ankles, toes): None, normal, Trunk Movements Neck, shoulders, hips: None, normal, Overall Severity Severity of abnormal movements (highest score from questions above): None, normal Incapacitation due to abnormal movements: None, normal Patient's awareness of abnormal movements (rate only patient's report): No Awareness, Dental Status Current problems with teeth and/or dentures?: Yes Does patient usually wear dentures?: No  CIWA:  CIWA-Ar Total: 0 COWS:  n/a   Musculoskeletal: Strength & Muscle Tone: within normal limits Gait & Station: normal Patient leans: N/A   Psychiatric Specialty Exam:  Presentation  General Appearance: Appropriate for Environment; Fairly Groomed  Eye Contact:Fleeting  Speech:Clear and Coherent  Speech Volume:Normal  Handedness:Right   Mood and Affect  Mood:Euthymic  Affect:Appropriate; Congruent   Thought Process  Thought Processes:Coherent  Descriptions of  Associations:Intact  Orientation:Full (Time, Place and Person)  Thought Content:Logical  History of Schizophrenia/Schizoaffective disorder:No data recorded Duration of Psychotic Symptoms:No data recorded Hallucinations:Hallucinations: None  Ideas of Reference:None  Suicidal Thoughts:Suicidal Thoughts: No  Homicidal Thoughts:Homicidal Thoughts: No   Sensorium  Memory:Immediate Good; Recent Good  Judgment:Good  Insight:Good   Executive Functions  Concentration:Good  Attention Span:Good  Recall:Good  Fund of Knowledge:Good  Language:Good   Psychomotor Activity  Psychomotor Activity:Psychomotor  Activity: Normal   Assets  Assets:Communication Skills   Sleep  Sleep:Sleep: Good    Physical Exam: Physical Exam Constitutional:      Appearance: Normal appearance.  HENT:     Head: Normocephalic.     Nose: Nose normal. No congestion or rhinorrhea.  Eyes:     Pupils: Pupils are equal, round, and reactive to light.  Pulmonary:     Effort: Pulmonary effort is normal.  Musculoskeletal:        General: Normal range of motion.     Cervical back: Normal range of motion.  Neurological:     Mental Status: She is alert and oriented to person, place, and time.  Psychiatric:        Behavior: Behavior normal.   Review of Systems  Constitutional: Negative.  Negative for fever.  HENT: Negative.  Negative for sore throat.   Eyes: Negative.   Respiratory: Negative.  Negative for cough.   Cardiovascular: Negative.   Gastrointestinal: Negative.  Negative for heartburn.  Genitourinary: Negative.   Musculoskeletal: Negative.   Skin: Negative.   Neurological: Negative.   Psychiatric/Behavioral:  Positive for depression (Patient denies SI/HI/AVH and verbally contracts for safety outside of this [facility]) and substance abuse (Educated on substance use cessation). Negative for hallucinations and suicidal ideas. The patient is not nervous/anxious and does not have insomnia.   Blood pressure 110/69, pulse (!) 58, temperature 98 F (36.7 C), temperature source Oral, resp. rate 15, height  (1.6 m), weight 91.2 kg, SpO2 99 %. Body mass index is 35.61 kg/m.   Social History   Tobacco Use  Smoking Status Never  Smokeless Tobacco Not on file   Tobacco Cessation:  A prescription for an FDA-approved tobacco cessation medication was offered at discharge and the patient refused  Blood Alcohol level:  Lab Results  Component Value Date   ETH <10 08/02/2021   Metabolic Disorder Labs:  Lab Results  Component Value Date   HGBA1C 5.0 08/06/2021   MPG 96.8 08/06/2021   No results found for:  PROLACTIN Lab Results  Component Value Date   CHOL 209 (H) 08/06/2021   TRIG 173 (H) 08/06/2021   HDL 27 (L) 08/06/2021   CHOLHDL 7.7 08/06/2021   VLDL 35 08/06/2021   LDLCALC 147 (H) 08/06/2021   See Psychiatric Specialty Exam and Suicide Risk Assessment completed by Attending Physician prior to discharge.  Discharge destination:  Home  Is patient on multiple antipsychotic therapies at discharge:  No   Has Patient had three or more failed trials of antipsychotic monotherapy by history:  No  Recommended Plan for Multiple Antipsychotic Therapies: NA  Allergies as of 08/06/2021       Reactions   Lactose Intolerance (gi) Diarrhea        Medication List    You have not been prescribed any medications.     Follow-up Information     [facility] [employer] information: [address] [place] [address] [phone]  Follow-up recommendations:   The patient is able to verbalize their individual safety plan to this provider.   # It is recommended to the patient to continue psychiatric medications as prescribed, after discharge from the hospital.     # It is recommended to the patient to follow up with your outpatient psychiatric provider and PCP.   # It was discussed with the patient, the impact of alcohol, drugs, tobacco have been there overall psychiatric and medical wellbeing, and total abstinence from substance use was recommended the patient.ed.   # Prescriptions provided or sent directly to preferred pharmacy at discharge. Patient agreeable to plan. Given opportunity to ask questions. Appears to feel comfortable with discharge.    # In the event of worsening symptoms, the patient is instructed to call the crisis hotline, 911 and or go to the nearest ED for appropriate evaluation and treatment of symptoms. To follow-up with primary care provider for other medical issues, concerns and or health care needs   # Patient  was discharged home with a plan to follow up with the the [facility] as noted above. She has an appointment with this provider on  08/30/2021 at 2 pm.  Signed: Starleen Blue, NP 08/06/2021, 11:33 AM

## 2021-08-06 NOTE — Group Note (Signed)
Recreation Therapy Group Note   Group Topic:Stress Management  Group Date: 08/06/2021 Start Time: 0935 End Time: 0950 Facilitators: Caroll Rancher, LRT,CTRS Location: [address] Dayroom   Goal Area(s) Addresses:  Patient will actively participate in stress management techniques presented during session.  Patient will successfully identify benefit of practicing stress management post d/c.   Group Description: Guided Imagery. LRT provided education, instruction, and demonstration on practice of visualization via guided imagery. Patient was asked to participate in the technique introduced during session. LRT debriefed including topics of mindfulness, stress management and specific scenarios each patient could use these techniques. Patients were given suggestions of ways to access scripts post d/c and encouraged to explore Youtube and other apps available on smartphones, tablets, and computers.   Affect/Mood: N/A   Participation Level: Did not attend    Clinical Observations/Individualized Feedback:     Plan: Continue to engage patient in RT group sessions 2-3x/week.   Caroll Rancher, LRT,CTRS 08/06/2021 11:04 AM
